# Patient Record
Sex: Female | Born: 1950 | Race: White | Hispanic: No | Marital: Married | Smoking: Never smoker
Health system: Southern US, Community
[De-identification: ages and names within clinical notes are randomized; demographics above are authoritative.]

## PROBLEM LIST (undated history)

## (undated) DIAGNOSIS — E119 Type 2 diabetes mellitus without complications: Secondary | ICD-10-CM

## (undated) DIAGNOSIS — I10 Essential (primary) hypertension: Secondary | ICD-10-CM

## (undated) DIAGNOSIS — F419 Anxiety disorder, unspecified: Secondary | ICD-10-CM

## (undated) DIAGNOSIS — E079 Disorder of thyroid, unspecified: Secondary | ICD-10-CM

## (undated) DIAGNOSIS — K219 Gastro-esophageal reflux disease without esophagitis: Secondary | ICD-10-CM

## (undated) HISTORY — DX: Essential (primary) hypertension: I10

## (undated) HISTORY — PX: BREAST SURGERY: SHX581

## (undated) HISTORY — DX: Gastro-esophageal reflux disease without esophagitis: K21.9

## (undated) HISTORY — PX: DILATION AND CURETTAGE OF UTERUS: SHX78

## (undated) HISTORY — DX: Anxiety disorder, unspecified: F41.9

## (undated) HISTORY — DX: Type 2 diabetes mellitus without complications: E11.9

## (undated) HISTORY — PX: ABDOMINAL HYSTERECTOMY: SHX81

## (undated) HISTORY — PX: FOOT SURGERY: SHX648

## (undated) HISTORY — DX: Disorder of thyroid, unspecified: E07.9

---

## 1999-06-21 ENCOUNTER — Encounter: Admission: RE | Admit: 1999-06-21 | Discharge: 1999-06-21 | Payer: Self-pay | Admitting: Obstetrics and Gynecology

## 1999-06-21 ENCOUNTER — Encounter: Payer: Self-pay | Admitting: Obstetrics and Gynecology

## 2000-01-27 ENCOUNTER — Encounter: Admission: RE | Admit: 2000-01-27 | Discharge: 2000-01-27 | Payer: Self-pay | Admitting: Obstetrics and Gynecology

## 2000-01-27 ENCOUNTER — Encounter: Payer: Self-pay | Admitting: Obstetrics and Gynecology

## 2000-04-23 ENCOUNTER — Ambulatory Visit (HOSPITAL_COMMUNITY): Admission: RE | Admit: 2000-04-23 | Discharge: 2000-04-23 | Payer: Self-pay | Admitting: Cardiology

## 2000-05-02 ENCOUNTER — Encounter: Admission: RE | Admit: 2000-05-02 | Discharge: 2000-05-02 | Payer: Self-pay | Admitting: Endocrinology

## 2000-05-02 ENCOUNTER — Encounter: Payer: Self-pay | Admitting: Endocrinology

## 2000-08-12 ENCOUNTER — Ambulatory Visit (HOSPITAL_COMMUNITY): Admission: RE | Admit: 2000-08-12 | Discharge: 2000-08-12 | Payer: Self-pay | Admitting: Endocrinology

## 2000-08-12 ENCOUNTER — Encounter: Payer: Self-pay | Admitting: Endocrinology

## 2000-12-13 ENCOUNTER — Ambulatory Visit (HOSPITAL_BASED_OUTPATIENT_CLINIC_OR_DEPARTMENT_OTHER): Admission: RE | Admit: 2000-12-13 | Discharge: 2000-12-13 | Payer: Self-pay | Admitting: Orthopedic Surgery

## 2000-12-13 ENCOUNTER — Encounter (INDEPENDENT_AMBULATORY_CARE_PROVIDER_SITE_OTHER): Payer: Self-pay | Admitting: *Deleted

## 2001-07-29 ENCOUNTER — Encounter: Admission: RE | Admit: 2001-07-29 | Discharge: 2001-07-29 | Payer: Self-pay

## 2002-01-02 ENCOUNTER — Emergency Department (HOSPITAL_COMMUNITY): Admission: EM | Admit: 2002-01-02 | Discharge: 2002-01-02 | Payer: Self-pay | Admitting: Emergency Medicine

## 2002-03-19 ENCOUNTER — Encounter (INDEPENDENT_AMBULATORY_CARE_PROVIDER_SITE_OTHER): Payer: Self-pay | Admitting: *Deleted

## 2002-03-19 ENCOUNTER — Ambulatory Visit (HOSPITAL_COMMUNITY): Admission: RE | Admit: 2002-03-19 | Discharge: 2002-03-19 | Payer: Self-pay | Admitting: *Deleted

## 2002-04-01 ENCOUNTER — Encounter: Admission: RE | Admit: 2002-04-01 | Discharge: 2002-04-01 | Payer: Self-pay | Admitting: Endocrinology

## 2002-04-01 ENCOUNTER — Encounter: Payer: Self-pay | Admitting: Endocrinology

## 2003-04-15 ENCOUNTER — Encounter: Admission: RE | Admit: 2003-04-15 | Discharge: 2003-04-15 | Payer: Self-pay | Admitting: Endocrinology

## 2003-10-09 ENCOUNTER — Encounter: Admission: RE | Admit: 2003-10-09 | Discharge: 2003-10-09 | Payer: Self-pay | Admitting: *Deleted

## 2004-04-29 ENCOUNTER — Encounter: Admission: RE | Admit: 2004-04-29 | Discharge: 2004-04-29 | Payer: Self-pay | Admitting: Obstetrics and Gynecology

## 2005-04-11 ENCOUNTER — Encounter (INDEPENDENT_AMBULATORY_CARE_PROVIDER_SITE_OTHER): Payer: Self-pay | Admitting: Specialist

## 2005-04-11 ENCOUNTER — Ambulatory Visit (HOSPITAL_COMMUNITY): Admission: RE | Admit: 2005-04-11 | Discharge: 2005-04-11 | Payer: Self-pay | Admitting: *Deleted

## 2005-05-01 ENCOUNTER — Encounter: Admission: RE | Admit: 2005-05-01 | Discharge: 2005-05-01 | Payer: Self-pay | Admitting: Endocrinology

## 2006-06-27 ENCOUNTER — Encounter: Admission: RE | Admit: 2006-06-27 | Discharge: 2006-06-27 | Payer: Self-pay | Admitting: Endocrinology

## 2006-11-29 ENCOUNTER — Encounter: Admission: RE | Admit: 2006-11-29 | Discharge: 2006-11-29 | Payer: Self-pay | Admitting: Endocrinology

## 2006-12-26 ENCOUNTER — Encounter: Admission: RE | Admit: 2006-12-26 | Discharge: 2006-12-26 | Payer: Self-pay | Admitting: Endocrinology

## 2007-01-11 ENCOUNTER — Ambulatory Visit: Payer: Self-pay | Admitting: Hematology & Oncology

## 2007-01-31 LAB — CBC WITH DIFFERENTIAL/PLATELET
Basophils Absolute: 0 10*3/uL (ref 0.0–0.1)
EOS%: 2.2 % (ref 0.0–7.0)
Eosinophils Absolute: 0.2 10*3/uL (ref 0.0–0.5)
HCT: 34.9 % (ref 34.8–46.6)
HGB: 12.1 g/dL (ref 11.6–15.9)
MCH: 29 pg (ref 26.0–34.0)
MONO#: 0.6 10*3/uL (ref 0.1–0.9)
NEUT#: 5 10*3/uL (ref 1.5–6.5)
NEUT%: 63.4 % (ref 39.6–76.8)
RDW: 14.8 % — ABNORMAL HIGH (ref 11.3–14.5)
lymph#: 2.1 10*3/uL (ref 0.9–3.3)

## 2007-02-04 LAB — RHEUMATOID FACTOR: Rhuematoid fact SerPl-aCnc: <20 IU/mL (ref 0–20)

## 2007-02-04 LAB — PROTEIN ELECTROPHORESIS, SERUM
Albumin ELP: 54.3 % — ABNORMAL LOW (ref 55.8–66.1)
Alpha-1-Globulin: 4.9 % (ref 2.9–4.9)
Alpha-2-Globulin: 10.6 % (ref 7.1–11.8)
Beta Globulin: 7 % (ref 4.7–7.2)
Total Protein, Serum Electrophoresis: 7.2 g/dL (ref 6.0–8.3)

## 2007-02-04 LAB — ANA: Anti Nuclear Antibody(ANA): NEGATIVE

## 2007-02-04 LAB — VITAMIN B12: Vitamin B-12: 352 pg/mL (ref 211–911)

## 2007-03-19 ENCOUNTER — Ambulatory Visit: Payer: Self-pay | Admitting: Hematology & Oncology

## 2007-03-21 LAB — CBC WITH DIFFERENTIAL/PLATELET
BASO%: 0.4 % (ref 0.0–2.0)
Basophils Absolute: 0 10*3/uL (ref 0.0–0.1)
EOS%: 2.7 % (ref 0.0–7.0)
MCH: 28.7 pg (ref 26.0–34.0)
MCHC: 34.5 g/dL (ref 32.0–36.0)
MCV: 83.1 fL (ref 81.0–101.0)
MONO%: 6 % (ref 0.0–13.0)
NEUT%: 56.1 % (ref 39.6–76.8)
RDW: 14.9 % — ABNORMAL HIGH (ref 11.3–14.5)
lymph#: 2.6 10*3/uL (ref 0.9–3.3)

## 2007-03-21 LAB — TECHNOLOGIST REVIEW

## 2007-04-16 ENCOUNTER — Encounter: Admission: RE | Admit: 2007-04-16 | Discharge: 2007-04-16 | Payer: Self-pay | Admitting: Endocrinology

## 2007-06-14 ENCOUNTER — Emergency Department (HOSPITAL_COMMUNITY): Admission: EM | Admit: 2007-06-14 | Discharge: 2007-06-14 | Payer: Self-pay | Admitting: Emergency Medicine

## 2007-12-27 ENCOUNTER — Encounter: Admission: RE | Admit: 2007-12-27 | Discharge: 2007-12-27 | Payer: Self-pay | Admitting: Endocrinology

## 2008-01-17 ENCOUNTER — Encounter: Admission: RE | Admit: 2008-01-17 | Discharge: 2008-01-17 | Payer: Self-pay | Admitting: Endocrinology

## 2008-01-28 ENCOUNTER — Ambulatory Visit (HOSPITAL_COMMUNITY): Admission: RE | Admit: 2008-01-28 | Discharge: 2008-01-28 | Payer: Self-pay | Admitting: *Deleted

## 2008-08-26 ENCOUNTER — Encounter: Admission: RE | Admit: 2008-08-26 | Discharge: 2008-08-26 | Payer: Self-pay | Admitting: Endocrinology

## 2008-11-05 ENCOUNTER — Encounter: Admission: RE | Admit: 2008-11-05 | Discharge: 2008-11-05 | Payer: Self-pay | Admitting: Endocrinology

## 2009-04-27 ENCOUNTER — Encounter: Admission: RE | Admit: 2009-04-27 | Discharge: 2009-04-27 | Payer: Self-pay | Admitting: Endocrinology

## 2009-09-13 ENCOUNTER — Emergency Department (HOSPITAL_COMMUNITY): Admission: EM | Admit: 2009-09-13 | Discharge: 2009-09-13 | Payer: Self-pay | Admitting: Emergency Medicine

## 2009-09-17 ENCOUNTER — Ambulatory Visit (HOSPITAL_BASED_OUTPATIENT_CLINIC_OR_DEPARTMENT_OTHER): Admission: RE | Admit: 2009-09-17 | Discharge: 2009-09-17 | Payer: Self-pay | Admitting: Otolaryngology

## 2010-06-03 ENCOUNTER — Encounter
Admission: RE | Admit: 2010-06-03 | Discharge: 2010-06-03 | Payer: Self-pay | Source: Home / Self Care | Attending: Endocrinology | Admitting: Endocrinology

## 2010-06-12 ENCOUNTER — Encounter: Payer: Self-pay | Admitting: Endocrinology

## 2010-08-09 LAB — BASIC METABOLIC PANEL
BUN: 13 mg/dL (ref 6–23)
CO2: 30 mEq/L (ref 19–32)
Calcium: 8.9 mg/dL (ref 8.4–10.5)
Chloride: 105 mEq/L (ref 96–112)
Creatinine, Ser: 0.88 mg/dL (ref 0.4–1.2)
GFR calc Af Amer: >60 mL/min (ref >60)
GFR calc non Af Amer: >60 mL/min (ref >60)
Glucose, Bld: 126 mg/dL — ABNORMAL HIGH (ref 70–99)
Potassium: 3.9 mEq/L (ref 3.5–5.1)
Sodium: 141 mEq/L (ref 135–145)

## 2010-08-09 LAB — GLUCOSE, CAPILLARY: Glucose-Capillary: 127 mg/dL — ABNORMAL HIGH (ref 70–99)

## 2010-08-09 LAB — POCT HEMOGLOBIN-HEMACUE: Hemoglobin: 12 g/dL (ref 12.0–15.0)

## 2010-09-02 ENCOUNTER — Other Ambulatory Visit: Payer: Self-pay | Admitting: Gastroenterology

## 2010-10-04 NOTE — Op Note (Signed)
Laura [name], HADLOCK                 ACCOUNT NO.:  [id number]   MEDICAL RECORD NO.:  [id number]          PATIENT TYPE:  AMB   LOCATION:  ENDO                         FACILITY:  [facility]   PHYSICIAN:  Georgiana Spinner, M.D.    DATE OF BIRTH:  November 04, 1950   DATE OF PROCEDURE:  01/28/2008  DATE OF DISCHARGE:                               OPERATIVE REPORT   PROCEDURE:  Upper endoscopy.   INDICATIONS:  Abdominal pain.   ANESTHESIA:  Fentanyl 100 mcg, Versed 10 mg.   PROCEDURE:  With the patient mildly sedated in the left lateral  decubitus position, the Pentax videoscopic endoscope was inserted in the  mouth, passed under direct vision through the esophagus which appeared  normal.  We entered into the stomach.  Fundus, body, antrum appeared  normal as did duodenal bulb, second portion of duodenum.  From this  point the endoscope was slowly withdrawn, taking circumferential views  of duodenal mucosa until the endoscope had been pulled back into the  stomach, placed in retroflexion to view the stomach from below.  The  endoscope was straightened and withdrawn, taking circumferential views  of the remaining gastric and esophageal mucosa.  The patient's vital  signs and pulse oximeter remained stable.  The patient tolerated the  procedure well without apparent complication.   FINDINGS:  Negative examination planned.  The patient states her  symptoms are improved, but will have her follow-up with me as an  outpatient.           ______________________________  Georgiana Spinner, M.D.     GMO/MEDQ  D:  01/28/2008  T:  01/28/2008  Job:  [id number]

## 2010-10-07 NOTE — Op Note (Signed)
   NAME:  Laura [name], Laura [name]                           ACCOUNT NO.:  [id number]   MEDICAL RECORD NO.:  [id number]                   PATIENT TYPE:  AMB   LOCATION:  ENDO                                 [facility]ACILITY:  [facility]   PHYSICIAN:  Georgiana Spinner, M.D.                 DATE OF BIRTH:  1951-04-12   DATE OF PROCEDURE:  03/19/2002  DATE OF DISCHARGE:                                 OPERATIVE REPORT   PROCEDURE:  Colonoscopy.   INDICATIONS FOR PROCEDURE:  Previous colon polyps.   ANESTHESIA:  Demerol 30 mg, Versed 3 mg.   PROCEDURE:  With the patient mildly sedated in the left lateral decubitus  position, the Olympus videoscopic colonoscope was inserted into the rectum  and passed under direct vision with pressure applied to the abdomen we  reached the cecum.  The cecum was identified by ileocecal valve and  appendiceal orifice, both of which were photographed.  From this point, the  colonoscope was slowly withdrawn taking circumferential views of the entire  colonic mucosa, stopping only in the rectum, which appeared normal and  showed hemorrhoids on retroflex view.  The endoscope was straightened,  withdrawn.  The patient's vital signs and pulse oximetry remained stable.  The patient tolerated the procedure well without apparent complications.   FINDINGS:  Internal hemorrhoids.  Otherwise, unremarkable colonoscopic  examination to the cecum.   PLAN:  Repeat examination in five years.                                                Georgiana Spinner, M.D.    GMO/MEDQ  D:  03/19/2002  T:  03/19/2002  Job:  [id number]

## 2010-10-07 NOTE — Cardiovascular Report (Signed)
[place]  Patient:    Laura [name], Laura [name]                        MRN: [id number] Proc. Date: 04/23/00 Adm. Date:  [id number] Attending:  Silvestre Mesi CC:         Bernadene Person, M.D.  Cardiac Catheterization Laboratory   Cardiac Catheterization  PROCEDURES: 1. Left heart catheterization. 2. Coronary cineangiography. 3. Left ventricular cineangiography. 4. Perclose of the right femoral artery.  REFERRING PHYSICIAN:  Bernadene Person, M.D.  INDICATIONS FOR PROCEDURES:  This 60 year old female presented with very typical angina type pain and a treadmill exercise tolerance test done in our office was early positive for myocardial ischemia.  She was then scheduled for cardiac catheterization.  DESCRIPTION OF PROCEDURE:  After signing an informed consent, the patient was premedicated with 50 mg of Benadryl intravenously and brought to the cardiac catheterization lab at [facility].  Her right groin was prepped and draped in a sterile fashion and anesthetized locally with 1% lidocaine.  A #6 French introducer sheath was inserted percutaneously into the right femoral artery.  The 6 [place] #4 Judkins coronary catheters were used to make injections into the coronary arteries.  A 6 French pigtail catheter was used to measure pressures in the left ventricle and aorta and to make mid stream injections into the left ventricle.  The patient tolerated the procedure well and no complications were noted.  At the end of the procedure, the catheter and sheaths were removed from the right femoral artery and hemostasis was easily obtained with a Perclose closure system.  MEDICATIONS GIVEN:  None.  HEMODYNAMIC DATA:  Left ventricular pressure 177/0-18, aortic pressure 176/95 with a mean of 126.  Left ventricular ejection fraction 68%.  CINE FINDINGS:  CORONARY CINEANGIOGRAPHY:  Left coronary artery:  The ostium and left main appear  normal.  Left anterior descending:  Appears normal.  Circumflex coronary artery:  The circumflex coronary artery appears normal.  Right coronary artery:  Appears normal.  LEFT VENTRICULAR CINEANGIOGRAM:  The left ventricular chamber size, contractility and wall thickness appear normal.  The mitral and aortic valves appear normal.  FINAL DIAGNOSES: 1. Normal coronary arteries. 2. Normal left ventricular function. 3. Normal mitral and aortic valves. 4. Successful Perclose of the right femoral artery.  DISPOSITION:  We will monitor on the short-stay unit prior to discharge later today.  We will refer back to Dr. Juleen China for further management and we will also followup the patient in the office in two weeks after discharge. DD:  04/23/00 TD:  04/23/00 Job: [id number]

## 2010-10-07 NOTE — Op Note (Signed)
NAMECALVARY, Laura [name]                 ACCOUNT NO.:  [id number]   MEDICAL RECORD NO.:  [id number]          PATIENT TYPE:  AMB   LOCATION:  ENDO                         FACILITY:  [facility]   PHYSICIAN:  Georgiana Spinner, M.D.    DATE OF BIRTH:  1950-06-05   DATE OF PROCEDURE:  04/11/2005  DATE OF DISCHARGE:                                 OPERATIVE REPORT   PROCEDURE:  Upper endoscopy.   INDICATIONS:  GERD.   ANESTHESIA:  Demerol 60 Versed 8 mg.   PROCEDURE:  With the patient mildly sedated in the left lateral decubitus  position, the Olympus videoscopic endoscope was inserted in the mouth and  passed under direct vision through the esophagus which appeared normal,  except there was one area of redness that may have been just normal variant,  which I decided to photograph and biopsy.  We entered into the stomach.  Fundus, body, antrum, duodenal bulb, second portion duodenum all appeared  normal.  From this point, the endoscope was slowly withdrawn taking  circumferential views of the duodenal mucosa until the endoscope had been  pulled back into the stomach and placed in retroflexion to view the stomach  from below. The endoscope was straightened and withdrawn, taking  circumferential views of the remaining gastric and esophageal mucosa. The  patient's vital signs and pulse oximeter remained stable. The patient  tolerated procedure well without apparent complications.   FINDINGS:  A small area of Barrett's esophagus, probably normal variant,  biopsied.  Await biopsy report. The patient will call me for results and  follow-up with me as an outpatient.  Proceed to colonoscopy.           ______________________________  Georgiana Spinner, M.D.     GMO/MEDQ  D:  04/11/2005  T:  04/11/2005  Job:  [id number]

## 2010-10-07 NOTE — Op Note (Signed)
NAMECHASTIN, Laura [name]                 ACCOUNT NO.:  [id number]   MEDICAL RECORD NO.:  [id number]          PATIENT TYPE:  AMB   LOCATION:  ENDO                         FACILITY:  [facility]   PHYSICIAN:  Georgiana Spinner, M.D.    DATE OF BIRTH:  [birth year]   DATE OF PROCEDURE:  04/10/2005  DATE OF DISCHARGE:                                 OPERATIVE REPORT   PROCEDURE:  Colonoscopy.   INDICATIONS:  Colon cancer screening.   ANESTHESIA:  Versed 2 mg.   PROCEDURE:  With the patient mildly sedated in the left lateral decubitus  position, subsequently rolled to her back with pressure applied, the Olympus  videoscopic colonoscope had been inserted in the rectum and was passed under  direct vision to the cecum, identified by the ileocecal valve and  appendiceal orifice, both of which were photographed.  From this point the  colonoscope was slowly withdrawn taking circumferential views of colonic  mucosa, stopping only in the rectum, which appeared normal on direct and  showed hemorrhoids on retroflexed view.  The endoscope was straightened and  withdrawn.  The patient's vital signs and pulse oximetry remained stable.  The patient tolerated the procedure well without complications.   FINDINGS:  Internal hemorrhoids, otherwise an unremarkable colonoscopic  examination to the cecum.   PLAN:  Consider a repeat examination possibly in five to 10 years           ______________________________  Georgiana Spinner, M.D.     GMO/MEDQ  D:  04/11/2005  T:  04/11/2005  Job:  [id number]

## 2010-10-07 NOTE — Op Note (Signed)
   NAME:  Laura [name], Laura [name]                           ACCOUNT NO.:  [id number]   MEDICAL RECORD NO.:  [id number]                   PATIENT TYPE:  AMB   LOCATION:  ENDO                                 [facility]ACILITY:  [facility]   PHYSICIAN:  Georgiana Spinner, M.D.                 DATE OF BIRTH:  1950/06/08   DATE OF PROCEDURE:  03/19/2002  DATE OF DISCHARGE:                                 OPERATIVE REPORT   PROCEDURE:  Upper endoscopy.   INDICATIONS FOR PROCEDURE:  GERD.   ANESTHESIA:  Demerol 70 mg, Versed 7 mg.   PROCEDURE:  With the patient mildly sedated in the left lateral decubitus  position, the Olympus videoscopic endoscope was inserted into the mouth,  passed under direct vision through the esophagus, which appeared normal  except for a questionable area of Barrett's esophagus that was seen on  withdrawal which we photographed and biopsied.  This may have been normal  anatomy.  We entered into the stomach.  Fundus, body, antrum, duodenal bulb,  second portion of the duodenum and all appeared normal.  From this point,  the endoscope was slowly withdrawn taking circumferential views of the  entire duodenal mucosa until the endoscope was pulled back into the stomach,  placed in retroflex to view the stomach from below and possibly incomplete  wrap of the GE junction was seen.  This was photographed.  The endoscope was  then straightened, withdrawn, taking circumferential views of the remaining  gastric and esophageal mucosa.  The patient's vital signs and pulse oximetry  remained stable.  The patient tolerated the procedure well without apparent  complications.   FINDINGS:  1. Question of Barrett's esophagus.  2. Possibly normal variant of an incomplete wrap of the gastroesophageal     junction around the endoscope.   PLAN:  Await biopsy report.  The patient will call me for results and follow  up with me as an outpatient.  Continue present PPI therapy.                  Georgiana Spinner, M.D.    GMO/MEDQ  D:  03/19/2002  T:  03/19/2002  Job:  [id number]

## 2010-10-07 NOTE — Op Note (Signed)
[place]  Patient:    Laura [name], Laura [name]                          MRN: [id number] Proc. Date: 12/13/00 Attending:  Nicki Reaper, M.D. CC:         [facility] x 2   Operative Report  PREOPERATIVE DIAGNOSIS:  Cyst, left wrist.  POSTOPERATIVE DIAGNOSIS:  Cyst, left wrist.  OPERATION:  Excision of cyst, left wrist.  SURGEON:  Nicki Reaper, M.D.  ASSISTANT:  Joaquin Courts, R.N.  ANESTHESIA:  IV regional.  ANESTHESIOLOGIST:  Guadalupe Maple, M.D.  HISTORY:  The patient is a 60 year old female with a history of a mass on the radial aspect of her wrist.  She desires having this removed.  DESCRIPTION OF PROCEDURE:  The patient was brought to the operating room where an IV regional anesthetic was carried out without difficulty.  She was prepped and draped using Betadine scrub and solution, with the left arm free.  A transverse incision was made directly over the mass.  This was in the ______ and carried down through subcutaneous tissue.  Neurovascular structures were protected.  The cyst was medially encountered with blunt and S[place]deo dissection and this was dissected free.  The stalk was followed onto the volar aspect. The volar entrance into the joint was opened and debrided.  The wound was copiously irrigated with saline.  The specimen was sent to pathology.  The subcutaneous tissue was closed with interrupted 4-0 Vicryl and the skin with a subcuticular 4-0 Monocryl suture.  Steri-Strips were applied.  Sterile compressive dressing and splint was applied.  The patient tolerated the procedure well and was taken to the recovery room for observation in satisfactory condition.  She is discharged home to return to the [facility] in one week on Vicodin and Keflex. DD:  12/13/00 TD:  12/13/00 Job: [id number]

## 2011-02-09 LAB — COMPREHENSIVE METABOLIC PANEL
ALT: 13
AST: 18
Albumin: 3.1 — ABNORMAL LOW
Alkaline Phosphatase: 81
Calcium: 8.8
GFR calc Af Amer: >60
Potassium: 3.7
Sodium: 140
Total Protein: 6.6

## 2011-02-09 LAB — DIFFERENTIAL
Basophils Relative: 0
Eosinophils Relative: 2
Lymphs Abs: 2.1
Monocytes Absolute: 0.5
Monocytes Relative: 6

## 2011-02-09 LAB — URINALYSIS, ROUTINE W REFLEX MICROSCOPIC
Bilirubin Urine: NEGATIVE
Glucose, UA: NEGATIVE
Ketones, ur: NEGATIVE
Protein, ur: NEGATIVE
pH: 6

## 2011-02-09 LAB — CBC
Hemoglobin: 12.1
MCHC: 33.5
Platelets: ADEQUATE
RDW: 15.6 — ABNORMAL HIGH

## 2011-02-09 LAB — LIPASE, BLOOD: Lipase: 19

## 2011-05-12 IMAGING — CT CT MAXILLOFACIAL W/O CM
3 of 4 series · 15 of 47 positions shown, 18 images · non-contrast
Comparison: None.

CT HEAD

CLINICAL DATA: Fall striking face, abrasions and forehead, nose
and hands, pain

CT HEAD WITHOUT CONTRAST
CT MAXILLOFACIAL WITHOUT CONTRAST
TECHNIQUE: Multidetector CT imaging of the head and maxillofacial
structures were performed using the standard protocol without
intravenous contrast. Multiplanar CT image reconstructions of the
maxillofacial structures were also generated.

[Series 4: max st 2.0 h31s · axial · 0.34mm/px · z∈[+36,+172]mm · 9 of 85 slices shown, 12 images]
[im 9/85  brain]
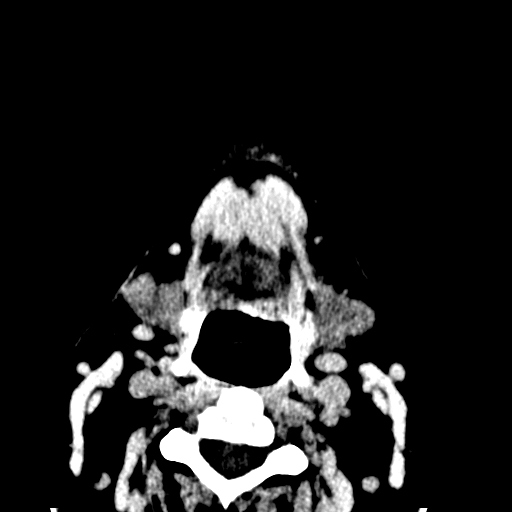
[im 9/85  bone]
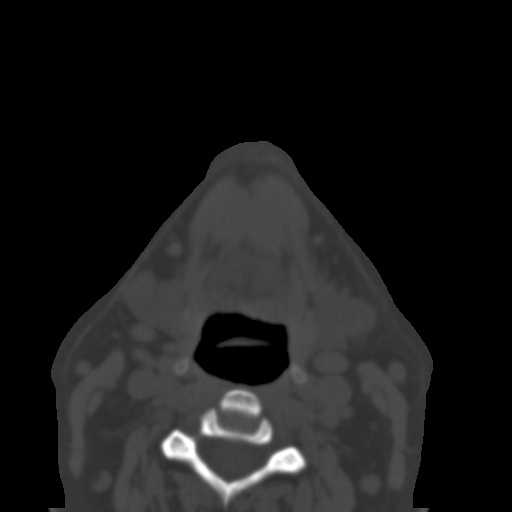
[im 17/85  bone]
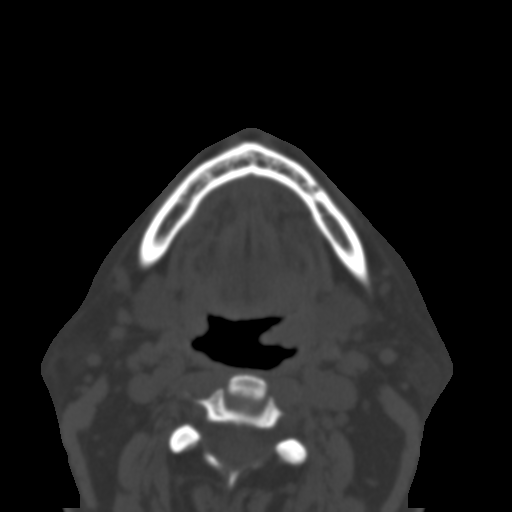
[im 25/85  bone]
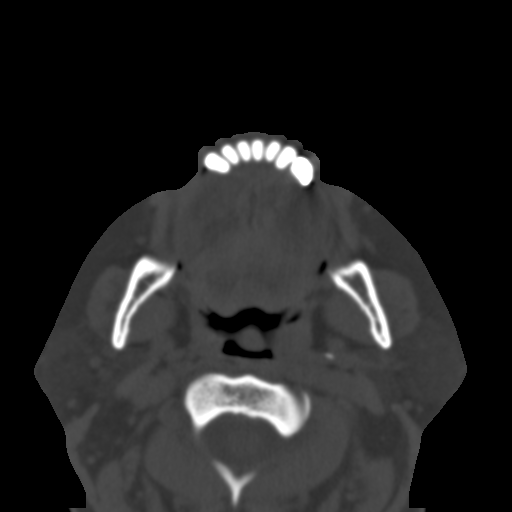
[im 33/85  bone]
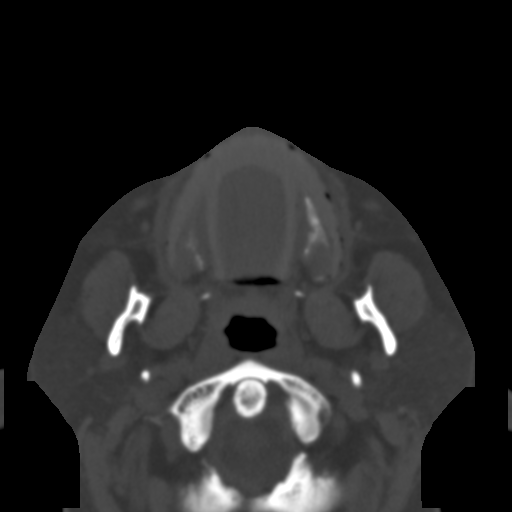
[im 45/85  brain]
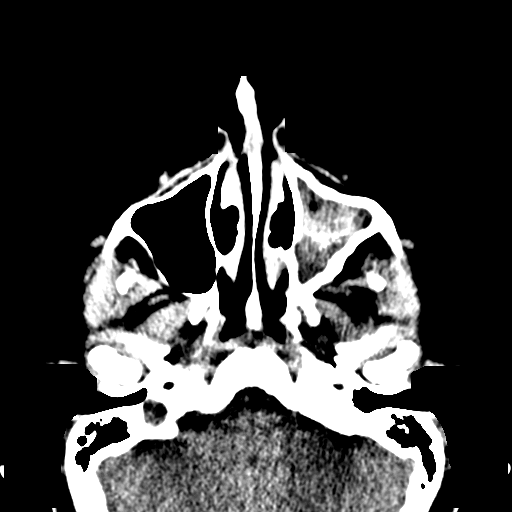
[im 45/85  bone]
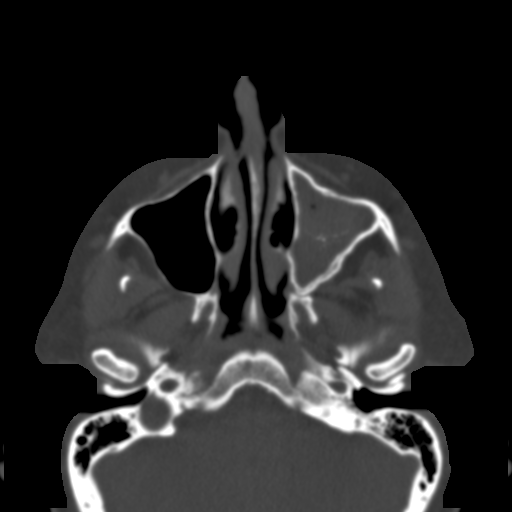
[im 53/85  bone]
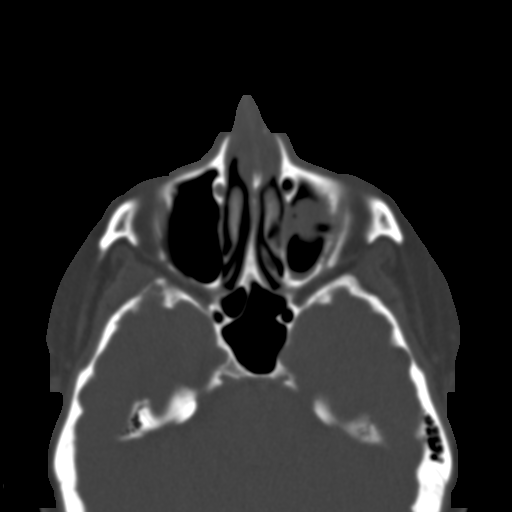
[im 61/85  bone]
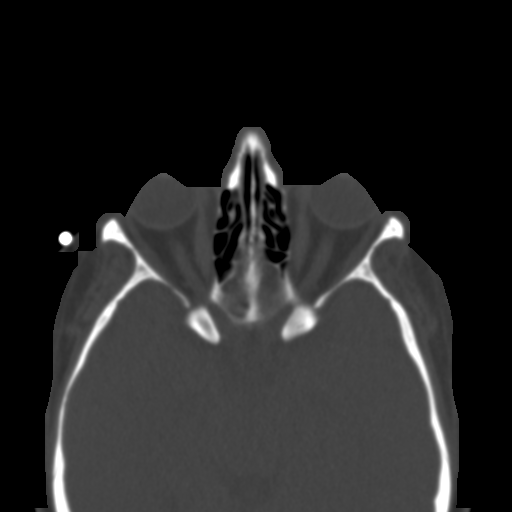
[im 69/85  bone]
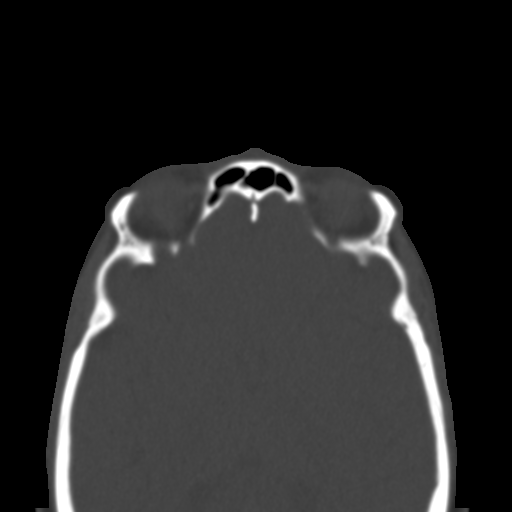
[im 77/85  brain]
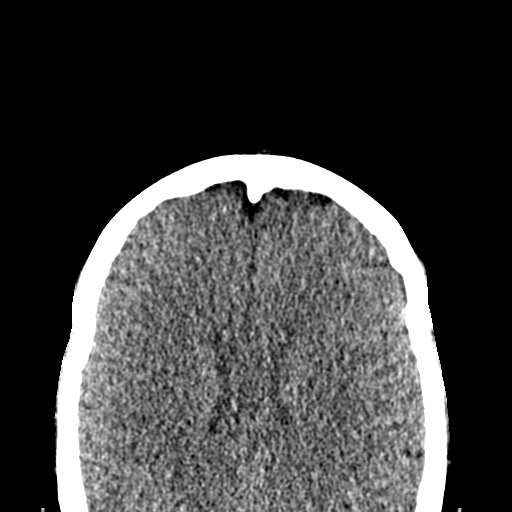
[im 77/85  bone]
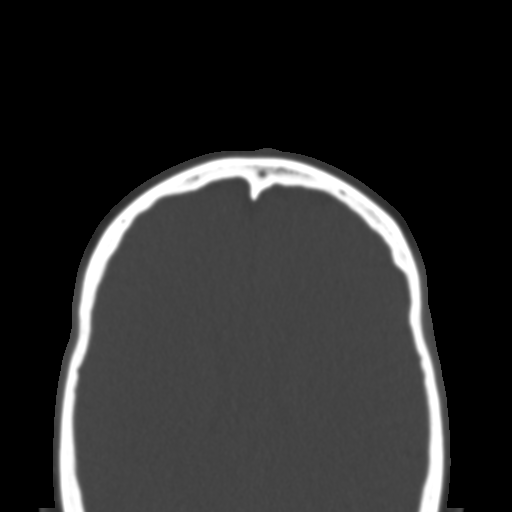

[Series 6: max st coronal · coronal · 0.33mm/px · 3 of 72 slices shown]
[im 24/72  bone]
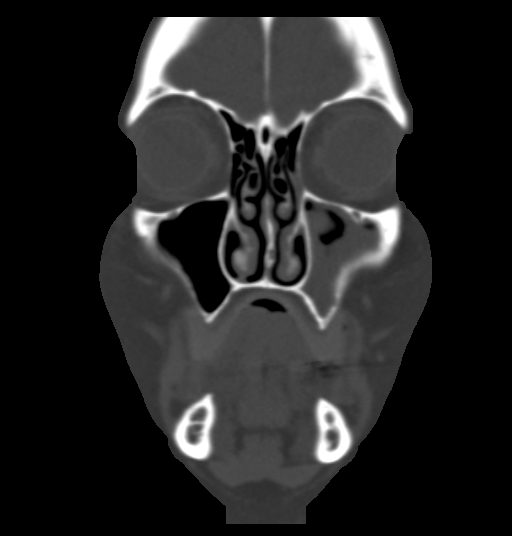
[im 32/72  bone]
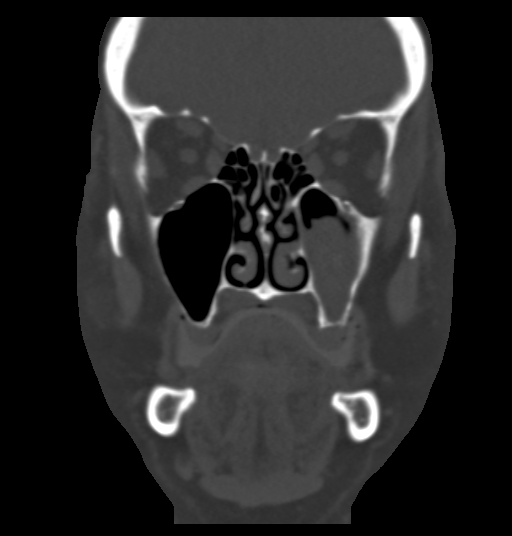
[im 40/72  bone]
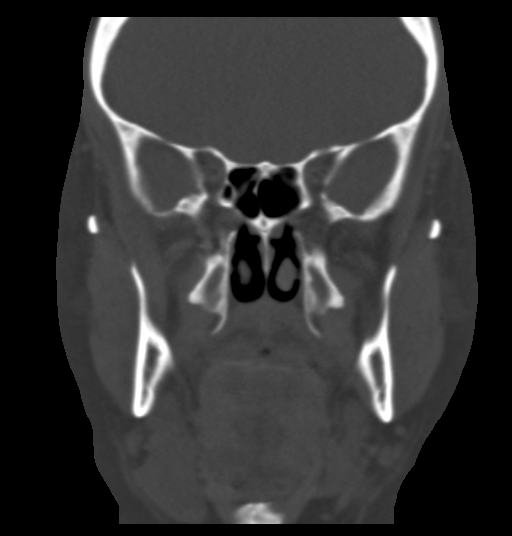

[Series 7: max st sag · sagittal · 0.32mm/px · 3 of 85 slices shown]
[im 29/85  bone]
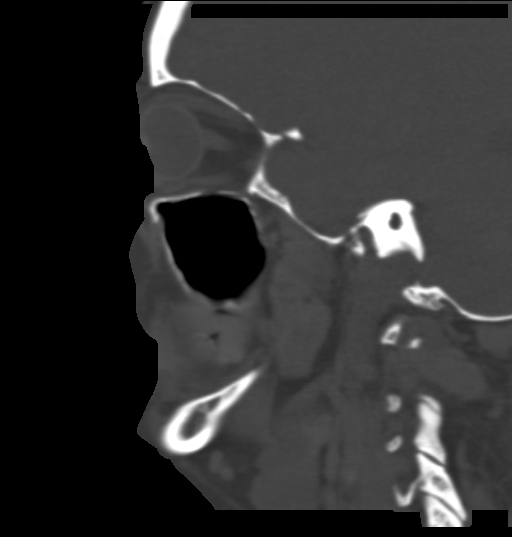
[im 43/85  bone]
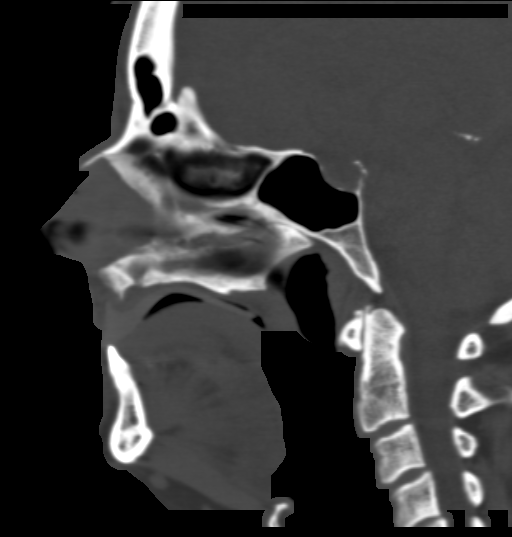
[im 57/85  bone]
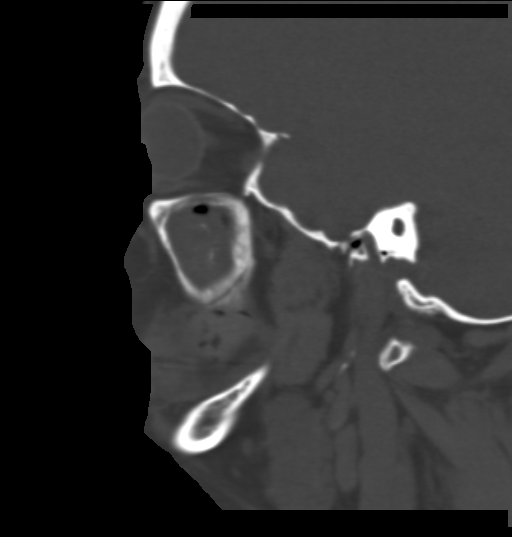

[15 of 47 positions shown; findings below may reference images not displayed]

FINDINGS: Streak artifacts at skull base.
Normal ventricular morphology.
No midline shift or mass effect.
Normal appearance of brain parenchyma.
No intracranial hemorrhage, mass lesion, or evidence of acute
infarction.
Minimal frontal scalp soft tissue swelling and hematoma.
Sub total opacification of the left maxillary sinus.
Left nasal bone fracture.
Calvarium intact.
IMPRESSION: No acute intracranial abnormalities, see below.

CT MAXILLOFACIAL
FINDINGS: Frontal scalp soft tissue swelling and small hematoma.
Intraorbital tissue planes clear.
Right side of face marked with a BB.
Sub total opacification of left maxillary sinus, containing high
attenuation question calcification, could represent inspissated
mucus or mucocele.
Scattered normal-sized anterior cervical lymph nodes bilaterally.
Symmetric appearance of parotid and submandibular glands.
Visualized intracranial structures unremarkable.
Bilateral nasal bone fractures, mildly displaced on the left.
Zygomas, orbits, and sinuses appear intact.
Chronic thickening of the osseous walls of the left maxillary sinus
suggests chronic sinusitis.
Minimal nasal septal deviation to the left.
No definite additional facial bone fracture identified.
IMPRESSION: Bilateral nasal bone fractures, minimally displaced on left.
No other definite facial bone fractures identified.
Suspect chronic sinusitis of the left maxillary sinus, cannot
exclude inspissated mucus or small mucocele.

## 2011-05-26 ENCOUNTER — Other Ambulatory Visit: Payer: Self-pay | Admitting: Endocrinology

## 2011-05-26 DIAGNOSIS — Z1231 Encounter for screening mammogram for malignant neoplasm of breast: Secondary | ICD-10-CM

## 2011-06-16 ENCOUNTER — Ambulatory Visit: Payer: Self-pay

## 2011-08-02 ENCOUNTER — Ambulatory Visit
Admission: RE | Admit: 2011-08-02 | Discharge: 2011-08-02 | Disposition: A | Discharge status: Home / Self Care | Payer: Managed Care, Other (non HMO) | Source: Ambulatory Visit | Attending: Endocrinology | Admitting: Endocrinology

## 2011-08-02 DIAGNOSIS — Z1231 Encounter for screening mammogram for malignant neoplasm of breast: Secondary | ICD-10-CM

## 2012-06-24 ENCOUNTER — Ambulatory Visit (INDEPENDENT_AMBULATORY_CARE_PROVIDER_SITE_OTHER): Payer: Self-pay | Admitting: Surgery

## 2012-06-25 ENCOUNTER — Encounter (INDEPENDENT_AMBULATORY_CARE_PROVIDER_SITE_OTHER): Payer: Self-pay

## 2012-06-28 ENCOUNTER — Other Ambulatory Visit (INDEPENDENT_AMBULATORY_CARE_PROVIDER_SITE_OTHER): Payer: Self-pay

## 2012-06-28 ENCOUNTER — Encounter (INDEPENDENT_AMBULATORY_CARE_PROVIDER_SITE_OTHER): Payer: Self-pay | Admitting: Surgery

## 2012-06-28 ENCOUNTER — Ambulatory Visit (INDEPENDENT_AMBULATORY_CARE_PROVIDER_SITE_OTHER): Payer: Private Health Insurance - Indemnity | Admitting: Surgery

## 2012-06-28 VITALS — BP 146/86 | HR 78 | Temp 98.3°F | Resp 16 | Ht 63.0 in | Wt 241.0 lb

## 2012-06-28 DIAGNOSIS — R109 Unspecified abdominal pain: Secondary | ICD-10-CM

## 2012-06-28 NOTE — Progress Notes (Signed)
Patient ID: Laura [name], female   DOB: Jun 06, 1950, 62 y.o.   MRN: [id number]  No chief complaint on file.   HPI Laura [name] is a 62 y.o. female.  Patient sent at the request of Dr. Juleen China for abdominal pain. She has a bulge in her upper abdomen she's had for a number of months. It's causing mild to moderate discomfort. Made worse with standing. Eating makes the bulge "hard". No nausea vomiting. HPI  Past Medical History  Diagnosis Date  . GERD (gastroesophageal reflux disease)   . Hypertension   . Anxiety   . Diabetes mellitus without complication   . Thyroid disease     Past Surgical History  Procedure Date  . Abdominal hysterectomy   . Dilation and curettage of uterus   . Breast surgery     breast mass    Family History  Problem Relation Age of Onset  . Hypertension Sister   . Ovarian cancer Daughter   . Leukemia Brother   . Thyroid disease Daughter     Social History History  Substance Use Topics  . Smoking status: Never Smoker   . Smokeless tobacco: Not on file  . Alcohol Use: No    Allergies  Allergen Reactions  . Aleve (Naproxen Sodium)   . Allegra (Fexofenadine Hcl)   . Avapro (Irbesartan)   . Cephalosporins   . Cytomel (Liothyronine) Hives  . Iohexol      Code: HIVES, Desc: HIVES S/P CONTRAST INJECTION//A.C., Onset Date: [id number]   . Ivp Dye (Iodinated Diagnostic Agents) Hives  . Januvia (Sitagliptin)   . Kombiglyze (Saxagliptin-Metformin Er)   . Lotrel (Amlodipine Besy-Benazepril Hcl)   . Metformin And Related   . Sulfa Antibiotics   . Toviaz (Fesoterodine Fumarate Er)     Current Outpatient Prescriptions  Medication Sig Dispense Refill  . aspirin 81 MG tablet Take 81 mg by mouth daily.      . Cholecalciferol (VITAMIN D PO) Take by mouth.      . dexlansoprazole (DEXILANT) 60 MG capsule Take 60 mg by mouth daily.      . diazepam (VALIUM) 5 MG tablet Take 5 mg by mouth every 6 (six) hours as needed.      Marland Kitchen estradiol (CLIMARA - DOSED IN MG/24  HR) 0.05 mg/24hr Place 1 patch onto the skin once a week.      . Glucosamine-Chondroit-Vit C-Mn (GLUCOSAMINE 1500 COMPLEX PO) Take by mouth.      . levothyroxine (SYNTHROID, LEVOTHROID) 175 MCG tablet Take 175 mcg by mouth daily.      Marland Kitchen losartan (COZAAR) 50 MG tablet Take 50 mg by mouth daily.      . metoprolol succinate (TOPROL-XL) 100 MG 24 hr tablet Take 100 mg by mouth daily. Take with or immediately following a meal.      . Multiple Vitamin (MULTI-VITAMIN DAILY PO) Take by mouth.      . pioglitazone-metformin (ACTOPLUS MET) 15-850 MG per tablet Take 1 tablet by mouth 2 (two) times daily with a meal.      . potassium chloride (KLOR-CON) 20 MEQ packet Take 20 mEq by mouth 2 (two) times daily.      Marland Kitchen triamterene-hydrochlorothiazide (MAXZIDE-25) 37.5-25 MG per tablet Take 1 tablet by mouth daily.        Review of Systems Review of Systems  Constitutional: Negative.   HENT: Negative.   Eyes: Negative.   Respiratory: Negative.   Cardiovascular: Negative.   Gastrointestinal: Positive for abdominal pain and abdominal distention.  Genitourinary: Negative.   Musculoskeletal: Negative.   Neurological: Negative.   Hematological: Negative.   Psychiatric/Behavioral: Negative.     Blood pressure 146/86, pulse 78, temperature 98.3 F (36.8 C), resp. rate 16, height 5\' 3"  (1.6 m), weight 241 lb (109.317 kg).  Physical Exam Physical Exam  Constitutional: She is oriented to person, place, and time. She appears well-developed and well-nourished.  HENT:  Head: Normocephalic and atraumatic.  Eyes: EOM are normal. Pupils are equal, round, and reactive to light.  Neck: Normal range of motion. Neck supple.  Cardiovascular: Normal rate and regular rhythm.   Pulmonary/Chest: Effort normal and breath sounds normal.  Abdominal: She exhibits no distension. There is no tenderness.    Musculoskeletal: Normal range of motion.  Neurological: She is alert and oriented to person, place, and time.  Skin:  Skin is warm.  Psychiatric: She has a normal mood and affect. Her behavior is normal. Judgment and thought content normal.      Assessment    Abdominal pain   Diastasis vs ventral hernia    Plan    Needs CT scan to evaluate further.  Return based on findings.       Janson Lamar A. 06/28/2012, 11:14 AM

## 2012-06-28 NOTE — Patient Instructions (Signed)
Will check CT scan to evaluate abdominal pain.

## 2012-07-02 ENCOUNTER — Ambulatory Visit
Admission: RE | Admit: 2012-07-02 | Discharge: 2012-07-02 | Disposition: A | Discharge status: Home / Self Care | Payer: Managed Care, Other (non HMO) | Source: Ambulatory Visit | Attending: Surgery | Admitting: Surgery

## 2012-07-02 DIAGNOSIS — R109 Unspecified abdominal pain: Secondary | ICD-10-CM

## 2012-07-08 ENCOUNTER — Telehealth (INDEPENDENT_AMBULATORY_CARE_PROVIDER_SITE_OTHER): Payer: Self-pay

## 2012-07-08 NOTE — Telephone Encounter (Signed)
LMOM to call to give ct results. Negative, no signs of hernia or anything needing surgery. Follow up with PCP.

## 2012-07-08 NOTE — Telephone Encounter (Signed)
Pt returned call and was given her CT results.

## 2012-08-21 ENCOUNTER — Encounter (INDEPENDENT_AMBULATORY_CARE_PROVIDER_SITE_OTHER): Payer: Self-pay

## 2012-10-18 ENCOUNTER — Other Ambulatory Visit: Payer: Self-pay

## 2012-10-18 DIAGNOSIS — Z1231 Encounter for screening mammogram for malignant neoplasm of breast: Secondary | ICD-10-CM

## 2012-11-19 ENCOUNTER — Ambulatory Visit
Admission: RE | Admit: 2012-11-19 | Discharge: 2012-11-19 | Disposition: A | Discharge status: Home / Self Care | Payer: Managed Care, Other (non HMO) | Source: Ambulatory Visit

## 2012-11-19 DIAGNOSIS — Z1231 Encounter for screening mammogram for malignant neoplasm of breast: Secondary | ICD-10-CM

## 2012-11-20 ENCOUNTER — Ambulatory Visit: Payer: Managed Care, Other (non HMO)

## 2013-10-10 ENCOUNTER — Other Ambulatory Visit: Payer: Self-pay

## 2013-10-10 DIAGNOSIS — Z1231 Encounter for screening mammogram for malignant neoplasm of breast: Secondary | ICD-10-CM

## 2013-11-28 ENCOUNTER — Ambulatory Visit
Admission: RE | Admit: 2013-11-28 | Discharge: 2013-11-28 | Disposition: A | Discharge status: Home / Self Care | Payer: Private Health Insurance - Indemnity | Source: Ambulatory Visit

## 2013-11-28 ENCOUNTER — Encounter (INDEPENDENT_AMBULATORY_CARE_PROVIDER_SITE_OTHER): Payer: Self-pay

## 2013-11-28 DIAGNOSIS — Z1231 Encounter for screening mammogram for malignant neoplasm of breast: Secondary | ICD-10-CM

## 2013-12-17 DIAGNOSIS — E1165 Type 2 diabetes mellitus with hyperglycemia: Secondary | ICD-10-CM

## 2013-12-17 DIAGNOSIS — E119 Type 2 diabetes mellitus without complications: Secondary | ICD-10-CM | POA: Insufficient documentation

## 2014-03-26 DIAGNOSIS — I10 Essential (primary) hypertension: Secondary | ICD-10-CM | POA: Insufficient documentation

## 2014-06-25 ENCOUNTER — Other Ambulatory Visit: Payer: Self-pay | Admitting: Gastroenterology

## 2014-08-12 ENCOUNTER — Ambulatory Visit (HOSPITAL_COMMUNITY)
Admission: RE | Admit: 2014-08-12 | Discharge: 2014-08-12 | Disposition: A | Discharge status: Home / Self Care | Payer: Managed Care, Other (non HMO) | Source: Ambulatory Visit | Attending: Endocrinology | Admitting: Endocrinology

## 2014-08-12 ENCOUNTER — Other Ambulatory Visit (HOSPITAL_COMMUNITY): Payer: Self-pay | Admitting: Endocrinology

## 2014-08-12 DIAGNOSIS — R109 Unspecified abdominal pain: Secondary | ICD-10-CM | POA: Insufficient documentation

## 2014-08-12 DIAGNOSIS — Z90710 Acquired absence of both cervix and uterus: Secondary | ICD-10-CM | POA: Diagnosis not present

## 2014-08-12 DIAGNOSIS — Z9049 Acquired absence of other specified parts of digestive tract: Secondary | ICD-10-CM | POA: Insufficient documentation

## 2014-12-17 DIAGNOSIS — E1169 Type 2 diabetes mellitus with other specified complication: Secondary | ICD-10-CM | POA: Insufficient documentation

## 2014-12-17 DIAGNOSIS — E785 Hyperlipidemia, unspecified: Secondary | ICD-10-CM

## 2014-12-17 DIAGNOSIS — E039 Hypothyroidism, unspecified: Secondary | ICD-10-CM | POA: Insufficient documentation

## 2015-02-26 ENCOUNTER — Other Ambulatory Visit: Payer: Self-pay

## 2015-02-26 DIAGNOSIS — Z1231 Encounter for screening mammogram for malignant neoplasm of breast: Secondary | ICD-10-CM

## 2015-03-26 ENCOUNTER — Ambulatory Visit
Admission: RE | Admit: 2015-03-26 | Discharge: 2015-03-26 | Disposition: A | Discharge status: Home / Self Care | Payer: Managed Care, Other (non HMO) | Source: Ambulatory Visit

## 2015-03-26 DIAGNOSIS — Z1231 Encounter for screening mammogram for malignant neoplasm of breast: Secondary | ICD-10-CM

## 2015-04-22 ENCOUNTER — Other Ambulatory Visit: Payer: Self-pay | Admitting: Obstetrics & Gynecology

## 2015-04-22 ENCOUNTER — Ambulatory Visit
Admission: RE | Admit: 2015-04-22 | Discharge: 2015-04-22 | Disposition: A | Discharge status: Home / Self Care | Payer: Managed Care, Other (non HMO) | Source: Ambulatory Visit | Attending: Obstetrics & Gynecology | Admitting: Obstetrics & Gynecology

## 2015-04-22 DIAGNOSIS — R0601 Orthopnea: Secondary | ICD-10-CM

## 2015-04-22 DIAGNOSIS — R059 Cough, unspecified: Secondary | ICD-10-CM

## 2015-04-22 DIAGNOSIS — R05 Cough: Secondary | ICD-10-CM

## 2015-05-28 ENCOUNTER — Ambulatory Visit
Admission: RE | Admit: 2015-05-28 | Discharge: 2015-05-28 | Disposition: A | Discharge status: Home / Self Care | Payer: Managed Care, Other (non HMO) | Source: Ambulatory Visit | Attending: Physician Assistant | Admitting: Physician Assistant

## 2015-05-28 ENCOUNTER — Other Ambulatory Visit: Payer: Self-pay | Admitting: Physician Assistant

## 2015-05-28 DIAGNOSIS — J208 Acute bronchitis due to other specified organisms: Secondary | ICD-10-CM

## 2015-05-28 DIAGNOSIS — R05 Cough: Secondary | ICD-10-CM

## 2015-05-28 DIAGNOSIS — R059 Cough, unspecified: Secondary | ICD-10-CM

## 2016-02-07 ENCOUNTER — Other Ambulatory Visit: Payer: Self-pay | Admitting: Physician Assistant

## 2016-02-07 ENCOUNTER — Ambulatory Visit
Admission: RE | Admit: 2016-02-07 | Discharge: 2016-02-07 | Disposition: A | Discharge status: Home / Self Care | Payer: Managed Care, Other (non HMO) | Source: Ambulatory Visit | Attending: Physician Assistant | Admitting: Physician Assistant

## 2016-02-07 DIAGNOSIS — R059 Cough, unspecified: Secondary | ICD-10-CM

## 2016-02-07 DIAGNOSIS — R05 Cough: Secondary | ICD-10-CM

## 2016-02-23 ENCOUNTER — Other Ambulatory Visit: Payer: Self-pay | Admitting: Family Medicine

## 2016-02-23 DIAGNOSIS — Z1231 Encounter for screening mammogram for malignant neoplasm of breast: Secondary | ICD-10-CM

## 2016-03-28 ENCOUNTER — Ambulatory Visit
Admission: RE | Admit: 2016-03-28 | Discharge: 2016-03-28 | Disposition: A | Discharge status: Home / Self Care | Payer: Managed Care, Other (non HMO) | Source: Ambulatory Visit | Attending: Family Medicine | Admitting: Family Medicine

## 2016-03-28 DIAGNOSIS — Z1231 Encounter for screening mammogram for malignant neoplasm of breast: Secondary | ICD-10-CM

## 2017-03-13 ENCOUNTER — Other Ambulatory Visit: Payer: Self-pay | Admitting: Family Medicine

## 2017-03-13 DIAGNOSIS — Z1231 Encounter for screening mammogram for malignant neoplasm of breast: Secondary | ICD-10-CM

## 2017-04-03 ENCOUNTER — Ambulatory Visit
Admission: RE | Admit: 2017-04-03 | Discharge: 2017-04-03 | Disposition: A | Discharge status: Home / Self Care | Payer: Managed Care, Other (non HMO) | Source: Ambulatory Visit | Attending: Family Medicine | Admitting: Family Medicine

## 2017-04-03 DIAGNOSIS — Z1231 Encounter for screening mammogram for malignant neoplasm of breast: Secondary | ICD-10-CM

## 2017-06-19 DIAGNOSIS — K219 Gastro-esophageal reflux disease without esophagitis: Secondary | ICD-10-CM | POA: Insufficient documentation

## 2017-07-03 DIAGNOSIS — D649 Anemia, unspecified: Secondary | ICD-10-CM | POA: Insufficient documentation

## 2017-07-03 DIAGNOSIS — D696 Thrombocytopenia, unspecified: Secondary | ICD-10-CM | POA: Insufficient documentation

## 2017-08-08 ENCOUNTER — Other Ambulatory Visit: Payer: Self-pay | Admitting: Nurse Practitioner

## 2017-08-08 ENCOUNTER — Ambulatory Visit
Admission: RE | Admit: 2017-08-08 | Discharge: 2017-08-08 | Disposition: A | Discharge status: Home / Self Care | Payer: 59 | Source: Ambulatory Visit | Attending: Nurse Practitioner | Admitting: Nurse Practitioner

## 2017-08-08 DIAGNOSIS — J40 Bronchitis, not specified as acute or chronic: Secondary | ICD-10-CM

## 2017-09-10 ENCOUNTER — Ambulatory Visit
Admission: RE | Admit: 2017-09-10 | Discharge: 2017-09-10 | Disposition: A | Discharge status: Home / Self Care | Payer: 59 | Source: Ambulatory Visit | Attending: Nurse Practitioner | Admitting: Nurse Practitioner

## 2017-09-10 ENCOUNTER — Other Ambulatory Visit: Payer: Self-pay | Admitting: Nurse Practitioner

## 2017-09-10 DIAGNOSIS — J189 Pneumonia, unspecified organism: Secondary | ICD-10-CM

## 2018-03-07 ENCOUNTER — Other Ambulatory Visit: Payer: Self-pay | Admitting: Otolaryngology

## 2018-03-07 ENCOUNTER — Telehealth: Payer: Self-pay

## 2018-03-07 DIAGNOSIS — D37030 Neoplasm of uncertain behavior of the parotid salivary glands: Secondary | ICD-10-CM

## 2018-03-07 DIAGNOSIS — F419 Anxiety disorder, unspecified: Secondary | ICD-10-CM | POA: Insufficient documentation

## 2018-03-07 DIAGNOSIS — E0500 Thyrotoxicosis with diffuse goiter without thyrotoxic crisis or storm: Secondary | ICD-10-CM | POA: Insufficient documentation

## 2018-03-07 NOTE — Telephone Encounter (Signed)
I spoke with Juliauna to let her know I just phoned her 13-hr prep in to her [facility] in [place].  Prednisone 50mg  PO 03/13/18 @ 2000, 03/14/18 @ 0200 and 0800.  Benadryl 50mg  PO 03/14/18 @ 0800.  Gypsy Lore, RN

## 2018-03-14 ENCOUNTER — Ambulatory Visit
Admission: RE | Admit: 2018-03-14 | Discharge: 2018-03-14 | Disposition: A | Discharge status: Home / Self Care | Payer: 59 | Source: Ambulatory Visit | Attending: Otolaryngology | Admitting: Otolaryngology

## 2018-03-14 DIAGNOSIS — D37030 Neoplasm of uncertain behavior of the parotid salivary glands: Secondary | ICD-10-CM

## 2018-03-14 MED ORDER — IOPAMIDOL (ISOVUE-300) INJECTION 61%
75.0000 mL | Freq: Once | INTRAVENOUS | Status: AC | PRN
  Administered 2018-03-14: 75 mL via INTRAVENOUS

## 2018-04-24 ENCOUNTER — Other Ambulatory Visit: Payer: Self-pay | Admitting: Family Medicine

## 2018-04-24 DIAGNOSIS — Z1231 Encounter for screening mammogram for malignant neoplasm of breast: Secondary | ICD-10-CM

## 2018-06-06 ENCOUNTER — Ambulatory Visit
Admission: RE | Admit: 2018-06-06 | Discharge: 2018-06-06 | Disposition: A | Discharge status: Home / Self Care | Payer: 59 | Source: Ambulatory Visit | Attending: Family Medicine | Admitting: Family Medicine

## 2018-06-06 DIAGNOSIS — Z1231 Encounter for screening mammogram for malignant neoplasm of breast: Secondary | ICD-10-CM

## 2019-04-28 ENCOUNTER — Other Ambulatory Visit: Payer: Self-pay | Admitting: Family Medicine

## 2019-04-28 DIAGNOSIS — Z1231 Encounter for screening mammogram for malignant neoplasm of breast: Secondary | ICD-10-CM

## 2019-06-17 ENCOUNTER — Ambulatory Visit
Admission: RE | Admit: 2019-06-17 | Discharge: 2019-06-17 | Disposition: A | Discharge status: Home / Self Care | Payer: BC Managed Care – PPO | Source: Ambulatory Visit | Attending: Family Medicine | Admitting: Family Medicine

## 2019-06-17 ENCOUNTER — Other Ambulatory Visit: Payer: Self-pay

## 2019-06-17 DIAGNOSIS — Z1231 Encounter for screening mammogram for malignant neoplasm of breast: Secondary | ICD-10-CM

## 2019-12-15 ENCOUNTER — Inpatient Hospital Stay (HOSPITAL_COMMUNITY)
Admission: EM | Admit: 2019-12-15 | Discharge: 2019-12-20 | DRG: 177 | Disposition: A | Discharge status: Home / Self Care | Payer: BC Managed Care – PPO | Attending: Internal Medicine | Admitting: Internal Medicine

## 2019-12-15 ENCOUNTER — Emergency Department (HOSPITAL_COMMUNITY): Payer: BC Managed Care – PPO

## 2019-12-15 DIAGNOSIS — Z806 Family history of leukemia: Secondary | ICD-10-CM | POA: Diagnosis not present

## 2019-12-15 DIAGNOSIS — E039 Hypothyroidism, unspecified: Secondary | ICD-10-CM | POA: Diagnosis present

## 2019-12-15 DIAGNOSIS — U071 COVID-19: Principal | ICD-10-CM | POA: Diagnosis present

## 2019-12-15 DIAGNOSIS — D649 Anemia, unspecified: Secondary | ICD-10-CM | POA: Diagnosis present

## 2019-12-15 DIAGNOSIS — J9601 Acute respiratory failure with hypoxia: Secondary | ICD-10-CM | POA: Diagnosis present

## 2019-12-15 DIAGNOSIS — E860 Dehydration: Secondary | ICD-10-CM | POA: Diagnosis present

## 2019-12-15 DIAGNOSIS — E669 Obesity, unspecified: Secondary | ICD-10-CM | POA: Diagnosis present

## 2019-12-15 DIAGNOSIS — E079 Disorder of thyroid, unspecified: Secondary | ICD-10-CM | POA: Diagnosis present

## 2019-12-15 DIAGNOSIS — Z8249 Family history of ischemic heart disease and other diseases of the circulatory system: Secondary | ICD-10-CM | POA: Diagnosis not present

## 2019-12-15 DIAGNOSIS — Z79899 Other long term (current) drug therapy: Secondary | ICD-10-CM

## 2019-12-15 DIAGNOSIS — Z8041 Family history of malignant neoplasm of ovary: Secondary | ICD-10-CM

## 2019-12-15 DIAGNOSIS — Z7982 Long term (current) use of aspirin: Secondary | ICD-10-CM

## 2019-12-15 DIAGNOSIS — N179 Acute kidney failure, unspecified: Secondary | ICD-10-CM | POA: Diagnosis present

## 2019-12-15 DIAGNOSIS — R748 Abnormal levels of other serum enzymes: Secondary | ICD-10-CM | POA: Diagnosis present

## 2019-12-15 DIAGNOSIS — Z6841 Body Mass Index (BMI) 40.0 and over, adult: Secondary | ICD-10-CM | POA: Diagnosis not present

## 2019-12-15 DIAGNOSIS — I10 Essential (primary) hypertension: Secondary | ICD-10-CM | POA: Diagnosis present

## 2019-12-15 DIAGNOSIS — F419 Anxiety disorder, unspecified: Secondary | ICD-10-CM | POA: Diagnosis present

## 2019-12-15 DIAGNOSIS — Z90710 Acquired absence of both cervix and uterus: Secondary | ICD-10-CM

## 2019-12-15 DIAGNOSIS — J1282 Pneumonia due to coronavirus disease 2019: Secondary | ICD-10-CM | POA: Diagnosis present

## 2019-12-15 DIAGNOSIS — E119 Type 2 diabetes mellitus without complications: Secondary | ICD-10-CM | POA: Diagnosis present

## 2019-12-15 DIAGNOSIS — R0602 Shortness of breath: Secondary | ICD-10-CM | POA: Diagnosis present

## 2019-12-15 DIAGNOSIS — Z79890 Hormone replacement therapy (postmenopausal): Secondary | ICD-10-CM | POA: Diagnosis not present

## 2019-12-15 DIAGNOSIS — Z20822 Contact with and (suspected) exposure to covid-19: Secondary | ICD-10-CM | POA: Diagnosis present

## 2019-12-15 DIAGNOSIS — K219 Gastro-esophageal reflux disease without esophagitis: Secondary | ICD-10-CM | POA: Diagnosis present

## 2019-12-15 LAB — COMPREHENSIVE METABOLIC PANEL
ALT: 27 U/L (ref 0–44)
AST: 49 U/L — ABNORMAL HIGH (ref 15–41)
Albumin: 3 g/dL — ABNORMAL LOW (ref 3.5–5.0)
Alkaline Phosphatase: 53 U/L (ref 38–126)
Anion gap: 12 (ref 5–15)
BUN: 53 mg/dL — ABNORMAL HIGH (ref 8–23)
CO2: 24 mmol/L (ref 22–32)
Calcium: 8.2 mg/dL — ABNORMAL LOW (ref 8.9–10.3)
Chloride: 100 mmol/L (ref 98–111)
Creatinine, Ser: 2.27 mg/dL — ABNORMAL HIGH (ref 0.44–1.00)
GFR calc Af Amer: 25 mL/min — ABNORMAL LOW (ref >60)
GFR calc non Af Amer: 22 mL/min — ABNORMAL LOW (ref >60)
Glucose, Bld: 198 mg/dL — ABNORMAL HIGH (ref 70–99)
Potassium: 4.4 mmol/L (ref 3.5–5.1)
Sodium: 136 mmol/L (ref 135–145)
Total Bilirubin: 0.7 mg/dL (ref 0.3–1.2)
Total Protein: 7.2 g/dL (ref 6.5–8.1)

## 2019-12-15 LAB — CBC WITH DIFFERENTIAL/PLATELET
Abs Immature Granulocytes: 0.02 10*3/uL (ref 0.00–0.07)
Basophils Absolute: 0 10*3/uL (ref 0.0–0.1)
Basophils Relative: 0 %
Eosinophils Absolute: 0 10*3/uL (ref 0.0–0.5)
Eosinophils Relative: 0 %
HCT: 35.3 % — ABNORMAL LOW (ref 36.0–46.0)
Hemoglobin: 10.7 g/dL — ABNORMAL LOW (ref 12.0–15.0)
Immature Granulocytes: 0 %
Lymphocytes Relative: 15 %
Lymphs Abs: 1 10*3/uL (ref 0.7–4.0)
MCH: 27 pg (ref 26.0–34.0)
MCHC: 30.3 g/dL (ref 30.0–36.0)
MCV: 89.1 fL (ref 80.0–100.0)
Monocytes Absolute: 0.4 10*3/uL (ref 0.1–1.0)
Monocytes Relative: 7 %
Neutro Abs: 5 10*3/uL (ref 1.7–7.7)
Neutrophils Relative %: 78 %
Platelets: UNDETERMINED 10*3/uL (ref 150–400)
RBC: 3.96 MIL/uL (ref 3.87–5.11)
RDW: 16.3 % — ABNORMAL HIGH (ref 11.5–15.5)
WBC: 6.4 10*3/uL (ref 4.0–10.5)
nRBC: 0 % (ref 0.0–0.2)

## 2019-12-15 LAB — D-DIMER, QUANTITATIVE: D-Dimer, Quant: 1.48 ug/mL-FEU — ABNORMAL HIGH (ref 0.00–0.50)

## 2019-12-15 LAB — LACTATE DEHYDROGENASE: LDH: 475 U/L — ABNORMAL HIGH (ref 98–192)

## 2019-12-15 LAB — HEMOGLOBIN A1C
Hgb A1c MFr Bld: 7 % — ABNORMAL HIGH (ref 4.8–5.6)
Mean Plasma Glucose: 154.2 mg/dL

## 2019-12-15 LAB — HEPATITIS B SURFACE ANTIGEN: Hepatitis B Surface Ag: NONREACTIVE

## 2019-12-15 LAB — BRAIN NATRIURETIC PEPTIDE: B Natriuretic Peptide: 42.5 pg/mL (ref 0.0–100.0)

## 2019-12-15 LAB — LACTIC ACID, PLASMA
Lactic Acid, Venous: 0.9 mmol/L (ref 0.5–1.9)
Lactic Acid, Venous: 1.6 mmol/L (ref 0.5–1.9)

## 2019-12-15 LAB — FIBRINOGEN: Fibrinogen: 419 mg/dL (ref 210–475)

## 2019-12-15 LAB — C-REACTIVE PROTEIN: CRP: 15.9 mg/dL — ABNORMAL HIGH (ref <1.0)

## 2019-12-15 LAB — FERRITIN
Ferritin: 1053 ng/mL — ABNORMAL HIGH (ref 11–307)
Ferritin: 946 ng/mL — ABNORMAL HIGH (ref 11–307)

## 2019-12-15 LAB — TROPONIN I (HIGH SENSITIVITY)
Troponin I (High Sensitivity): 39 ng/L — ABNORMAL HIGH (ref <18)
Troponin I (High Sensitivity): 47 ng/L — ABNORMAL HIGH (ref <18)

## 2019-12-15 LAB — IRON AND TIBC
Iron: 17 ug/dL — ABNORMAL LOW (ref 28–170)
Saturation Ratios: 7 % — ABNORMAL LOW (ref 10.4–31.8)
TIBC: 230 ug/dL — ABNORMAL LOW (ref 250–450)
UIBC: 213 ug/dL

## 2019-12-15 LAB — HIV ANTIBODY (ROUTINE TESTING W REFLEX): HIV Screen 4th Generation wRfx: NONREACTIVE

## 2019-12-15 LAB — TSH: TSH: 2.155 u[IU]/mL (ref 0.350–4.500)

## 2019-12-15 LAB — ABO/RH: ABO/RH(D): O POS

## 2019-12-15 LAB — PROCALCITONIN: Procalcitonin: <0.1 ng/mL

## 2019-12-15 LAB — CBG MONITORING, ED: Glucose-Capillary: 200 mg/dL — ABNORMAL HIGH (ref 70–99)

## 2019-12-15 LAB — SARS CORONAVIRUS 2 BY RT PCR (HOSPITAL ORDER, PERFORMED IN ~~LOC~~ HOSPITAL LAB): SARS Coronavirus 2: POSITIVE — AB

## 2019-12-15 MED ORDER — ONDANSETRON HCL 4 MG/2ML IJ SOLN: 4.0000 mg | Freq: Four times a day (QID) | INTRAMUSCULAR | Status: DC | PRN

## 2019-12-15 MED ORDER — DEXAMETHASONE SODIUM PHOSPHATE 10 MG/ML IJ SOLN
6.0000 mg | INTRAMUSCULAR | Status: DC
  Administered 2019-12-15 – 2019-12-20 (×6): 6 mg via INTRAVENOUS
  Filled 2019-12-15 (×6): qty 1

## 2019-12-15 MED ORDER — GUAIFENESIN-DM 100-10 MG/5ML PO SYRP
10.0000 mL | ORAL_SOLUTION | ORAL | Status: DC | PRN
  Administered 2019-12-15 – 2019-12-19 (×5): 10 mL via ORAL
  Filled 2019-12-15 (×5): qty 10

## 2019-12-15 MED ORDER — SODIUM CHLORIDE 0.9 % IV SOLN: INTRAVENOUS | Status: DC

## 2019-12-15 MED ORDER — ZINC SULFATE 220 (50 ZN) MG PO CAPS
220.0000 mg | ORAL_CAPSULE | Freq: Every day | ORAL | Status: DC
  Administered 2019-12-15 – 2019-12-20 (×7): 220 mg via ORAL
  Filled 2019-12-15 (×7): qty 1

## 2019-12-15 MED ORDER — SODIUM CHLORIDE 0.9% FLUSH
3.0000 mL | Freq: Once | INTRAVENOUS | Status: AC
  Administered 2019-12-15: 3 mL via INTRAVENOUS

## 2019-12-15 MED ORDER — ONDANSETRON HCL 4 MG PO TABS: 4.0000 mg | ORAL_TABLET | Freq: Four times a day (QID) | ORAL | Status: DC | PRN

## 2019-12-15 MED ORDER — ASCORBIC ACID 500 MG PO TABS
500.0000 mg | ORAL_TABLET | Freq: Every day | ORAL | Status: DC
  Administered 2019-12-15 – 2019-12-20 (×7): 500 mg via ORAL
  Filled 2019-12-15 (×7): qty 1

## 2019-12-15 MED ORDER — SODIUM CHLORIDE 0.9 % IV SOLN
200.0000 mg | Freq: Once | INTRAVENOUS | Status: AC
  Administered 2019-12-15: 200 mg via INTRAVENOUS
  Filled 2019-12-15: qty 40

## 2019-12-15 MED ORDER — HYDROCOD POLST-CPM POLST ER 10-8 MG/5ML PO SUER
5.0000 mL | Freq: Two times a day (BID) | ORAL | Status: DC | PRN
  Administered 2019-12-16 – 2019-12-19 (×3): 5 mL via ORAL
  Filled 2019-12-15 (×4): qty 5

## 2019-12-15 MED ORDER — INSULIN ASPART 100 UNIT/ML ~~LOC~~ SOLN
0.0000 [IU] | Freq: Every day | SUBCUTANEOUS | Status: DC
  Administered 2019-12-16: 3 [IU] via SUBCUTANEOUS
  Administered 2019-12-17 – 2019-12-18 (×2): 2 [IU] via SUBCUTANEOUS

## 2019-12-15 MED ORDER — ALBUTEROL SULFATE HFA 108 (90 BASE) MCG/ACT IN AERS
2.0000 | INHALATION_SPRAY | Freq: Four times a day (QID) | RESPIRATORY_TRACT | Status: DC
  Administered 2019-12-15 – 2019-12-20 (×20): 2 via RESPIRATORY_TRACT
  Filled 2019-12-15 (×2): qty 6.7

## 2019-12-15 MED ORDER — LOPERAMIDE HCL 2 MG PO CAPS
4.0000 mg | ORAL_CAPSULE | ORAL | Status: DC | PRN
  Administered 2019-12-15: 4 mg via ORAL
  Filled 2019-12-15: qty 2

## 2019-12-15 MED ORDER — ENOXAPARIN SODIUM 40 MG/0.4ML ~~LOC~~ SOLN
40.0000 mg | SUBCUTANEOUS | Status: DC
  Administered 2019-12-15: 40 mg via SUBCUTANEOUS
  Filled 2019-12-15: qty 0.4

## 2019-12-15 MED ORDER — INSULIN ASPART 100 UNIT/ML ~~LOC~~ SOLN
0.0000 [IU] | Freq: Three times a day (TID) | SUBCUTANEOUS | Status: DC
  Administered 2019-12-16 (×3): 11 [IU] via SUBCUTANEOUS
  Administered 2019-12-17: 8 [IU] via SUBCUTANEOUS
  Administered 2019-12-17: 11 [IU] via SUBCUTANEOUS
  Administered 2019-12-17: 8 [IU] via SUBCUTANEOUS
  Administered 2019-12-18: 11 [IU] via SUBCUTANEOUS
  Administered 2019-12-18 (×2): 8 [IU] via SUBCUTANEOUS
  Administered 2019-12-19: 5 [IU] via SUBCUTANEOUS
  Administered 2019-12-19 – 2019-12-20 (×3): 3 [IU] via SUBCUTANEOUS

## 2019-12-15 MED ORDER — SODIUM CHLORIDE 0.9 % IV SOLN
100.0000 mg | Freq: Every day | INTRAVENOUS | Status: AC
  Administered 2019-12-16 – 2019-12-19 (×4): 100 mg via INTRAVENOUS
  Filled 2019-12-15 (×6): qty 20

## 2019-12-15 MED ORDER — ACETAMINOPHEN 325 MG PO TABS
650.0000 mg | ORAL_TABLET | Freq: Four times a day (QID) | ORAL | Status: DC | PRN
  Filled 2019-12-15: qty 2

## 2019-12-15 MED ORDER — LACTATED RINGERS IV BOLUS
1000.0000 mL | Freq: Once | INTRAVENOUS | Status: AC
  Administered 2019-12-15: 1000 mL via INTRAVENOUS

## 2019-12-15 NOTE — ED Notes (Signed)
Pt had another episode of diarrhea. Pt cleaned.

## 2019-12-15 NOTE — H&P (Signed)
History and Physical    Laura [name] TGP:[id number] DOB: 02/23/1951 DOA: 12/15/2019  PCP: Linda Hedges, DO  Patient coming from: home  I have personally briefly reviewed patient's old medical records in [place]  Chief Complaint: Worsening cough, shortness of breath and diarrhea since 1 week.  HPI: Laura [name] is a 69 y.o. female with medical history significant of hypertension, GERD, hypothyroidism, diabetes mellitus, obesity presents to emergency department with worsening cough, shortness of breath and diarrhea since 1 week.  Patient tells me that she tested positive for COVID-19 6 days ago.  She continues to have worsening of her symptoms including cough, shortness of breath, diarrhea.  She reports nonbloody diarrhea 4-5 episodes per day, associated with decreased appetite, generalized weakness, lethargy.  Reports chills, nausea and headache however denies lightheadedness, dizziness, syncope, chest pain, wheezing, abdominal pain, urinary symptoms.  She denies recent COVID-19 exposure.  She did not receive COVID-19 vaccine.  Lives with her husband at home.  No history of smoking, alcohol, illicit drug use.  She uses cane for ambulation.  She does not use oxygen at home.  ED Course: Upon arrival to ED: Patient tachypneic, requiring 2 L of oxygen via nasal cannula, afebrile with no leukocytosis.  CMP shows AKI.  Chest x-ray shows multifocal pneumonia.  [facility] hospitalist consulted for admission for Covid pneumonia.  Review of Systems: As per HPI otherwise negative.    Past Medical History:  Diagnosis Date  . Anxiety   . Diabetes mellitus without complication ([place])   . GERD (gastroesophageal reflux disease)   . Hypertension   . Thyroid disease     Past Surgical History:  Procedure Laterality Date  . ABDOMINAL HYSTERECTOMY    . BREAST SURGERY     breast mass  . DILATION AND CURETTAGE OF UTERUS    . FOOT SURGERY       reports that she has never smoked. She does not  have any smokeless tobacco history on file. She reports that she does not drink alcohol and does not use drugs.  Allergies  Allergen Reactions  . Cytomel [Liothyronine] Hives  . Fluconazole Palpitations  . Iohexol Hives     Code: HIVES, Desc: HIVES S/P CONTRAST INJECTION//A.C., Onset Date: [id number]   . Ivp Dye [Iodinated Diagnostic Agents] Hives  . Aleve [Naproxen Sodium]   . Allegra [Fexofenadine Hcl]   . Avapro [Irbesartan]   . Brilliant Blue Fcf   . Cephalosporins   . Januvia [Sitagliptin]   . Kombiglyze [Saxagliptin-Metformin Er]   . Lotrel [Amlodipine Besy-Benazepril Hcl]   . Metformin And Related   . Sulfa Antibiotics   . Toviaz [Fesoterodine Fumarate Er]     Family History  Problem Relation Age of Onset  . Hypertension Sister   . Ovarian cancer Daughter   . Leukemia Brother   . Thyroid disease Daughter   . Liver disease Father   . Prostate cancer Other     Prior to Admission medications   Medication Sig Start Date End Date Taking? Authorizing Provider  aspirin 81 MG tablet Take 81 mg by mouth daily.    [provider]  Cholecalciferol (VITAMIN D PO) Take by mouth.    [provider]  dexlansoprazole (DEXILANT) 60 MG capsule Take 60 mg by mouth daily.    [provider]  diazepam (VALIUM) 5 MG tablet Take 5 mg by mouth every 6 (six) hours as needed.    [provider]  estradiol (CLIMARA - DOSED IN MG/24 HR)  0.05 mg/24hr Place 1 patch onto the skin once a week.    [provider]  Glucosamine-Chondroit-Vit C-Mn (GLUCOSAMINE 1500 COMPLEX PO) Take by mouth.    [provider]  levothyroxine (SYNTHROID, LEVOTHROID) 175 MCG tablet Take 175 mcg by mouth daily.    [provider]  losartan (COZAAR) 50 MG tablet Take 50 mg by mouth daily.    [provider]  metoprolol succinate (TOPROL-XL) 100 MG 24 hr tablet Take 100 mg by mouth daily. Take with or immediately following a meal.    [provider]  Multiple Vitamin (MULTI-VITAMIN DAILY PO) Take by mouth.    [provider]  pioglitazone-metformin (ACTOPLUS MET) 15-850 MG per tablet Take 1 tablet by mouth 2 (two) times daily with a meal.    [provider]  potassium chloride (KLOR-CON) 20 MEQ packet Take 20 mEq by mouth 2 (two) times daily.    [provider]  triamterene-hydrochlorothiazide (MAXZIDE-25) 37.5-25 MG per tablet Take 1 tablet by mouth daily.    [provider]    Physical Exam: Vitals:   12/15/19 1449 12/15/19 1621 12/15/19 1652 12/15/19 1653  BP: 101/81 (!) 115/53    Pulse: 89 92    Resp: (!) 24 (!) 24    Temp: 98.4 F (36.9 C) 99.6 F (37.6 C)    TempSrc: Oral Oral    SpO2: 93% (!) 88% 90% 96%    Constitutional: NAD, calm, comfortable, on 2 L of oxygen via nasal cannula, obese, communicating well, appears dehydrated Eyes: PERRL, lids and conjunctivae normal ENMT: Mucous membranes are dry. Posterior pharynx clear of any exudate or lesions.Normal dentition.  Neck: normal, supple, no masses, no thyromegaly Respiratory: clear to auscultation bilaterally, no wheezing, no crackles. Normal respiratory effort. No accessory muscle use.  Cardiovascular: Regular rate and rhythm, no murmurs / rubs / gallops. No extremity edema. 2+ pedal pulses. No carotid bruits.  Abdomen: no tenderness, no masses palpated. No hepatosplenomegaly. Bowel sounds positive.  Musculoskeletal: no clubbing / cyanosis. No joint deformity upper and lower extremities. Good ROM, no contractures. Normal muscle tone.  Skin: no rashes, lesions, ulcers. No induration Neurologic: CN 2-12 grossly intact. Sensation intact, DTR normal. Strength 5/5 in all 4.  Psychiatric: Normal judgment and insight. Alert and oriented x 3. Normal mood.    Labs on Admission: I have personally reviewed following labs and imaging studies  CBC: Recent Labs  Lab 12/15/19 1500  WBC 6.4  NEUTROABS 5.0  HGB 10.7*  HCT 35.3*  MCV 89.1    PLT PLATELET CLUMPS NOTED ON SMEAR, UNABLE TO ESTIMATE   Basic Metabolic Panel: Recent Labs  Lab 12/15/19 1500  NA 136  K 4.4  CL 100  CO2 24  GLUCOSE 198*  BUN 53*  CREATININE 2.27*  CALCIUM 8.2*   GFR: CrCl cannot be calculated (Unknown ideal weight.). Liver Function Tests: Recent Labs  Lab 12/15/19 1500  AST 49*  ALT 27  ALKPHOS 53  BILITOT 0.7  PROT 7.2  ALBUMIN 3.0*   No results for input(s): LIPASE, AMYLASE in the last 168 hours. No results for input(s): AMMONIA in the last 168 hours. Coagulation Profile: No results for input(s): INR, PROTIME in the last 168 hours. Cardiac Enzymes: No results for input(s): CKTOTAL, CKMB, CKMBINDEX, TROPONINI in the last 168 hours. BNP (last 3 results) No results for input(s): PROBNP in the last 8760 hours. HbA1C: No results for input(s): HGBA1C in the last 72 hours. CBG: No results for input(s): GLUCAP in  the last 168 hours. Lipid Profile: No results for input(s): CHOL, HDL, LDLCALC, TRIG, CHOLHDL, LDLDIRECT in the last 72 hours. Thyroid Function Tests: No results for input(s): TSH, T4TOTAL, FREET4, T3FREE, THYROIDAB in the last 72 hours. Anemia Panel: No results for input(s): VITAMINB12, FOLATE, FERRITIN, TIBC, IRON, RETICCTPCT in the last 72 hours. Urine analysis:    Component Value Date/Time   COLORURINE YELLOW 06/14/2007 [place] 06/14/2007 1041   LABSPEC 1.010 06/14/2007 1041   PHURINE 6.0 06/14/2007 1041   GLUCOSEU NEGATIVE 06/14/2007 1041   HGBUR NEGATIVE 06/14/2007 [place] 06/14/2007 [place] 06/14/2007 1041   PROTEINUR NEGATIVE 06/14/2007 1041   UROBILINOGEN 0.2 06/14/2007 1041   NITRITE NEGATIVE 06/14/2007 1041   LEUKOCYTESUR  06/14/2007 1041    NEGATIVE MICROSCOPIC NOT DONE ON URINES WITH NEGATIVE PROTEIN, BLOOD, LEUKOCYTES, NITRITE, OR GLUCOSE <1000 mg/dL.    Radiological Exams on Admission: DG Chest Portable 1 View  Result Date:  12/15/2019 CLINICAL DATA:  69 year old female with shortness of breath. Positive COVID-19. EXAM: PORTABLE CHEST 1 VIEW COMPARISON:  Chest radiograph dated 09/10/2017. FINDINGS: Bilateral faint streaky densities primarily involving the peripheral and subpleural region of the mid to lower lung field most consistent with pneumonia, likely viral or atypical in etiology. Clinical correlation and follow-up recommended. No lobar consolidation, pleural effusion, pneumothorax. Mild cardiomegaly. No acute osseous pathology. IMPRESSION: 1. Multifocal pneumonia, likely viral or atypical in etiology. Clinical correlation and follow-up recommended. 2. Mild cardiomegaly. Electronically Signed   By: Anner Crete M.D.   On: 12/15/2019 15:14   Assessment/Plan Principal Problem:   Acute hypoxemic respiratory failure (HCC) Active Problems:   Acquired hypothyroidism   Essential hypertension   Normocytic anemia   Diabetes mellitus without complication (HCC)   Pneumonia due to COVID-19 virus   AKI (acute kidney injury) ([place])   Acute hypoxemic respiratory failure due to COVID-19 [facility])    Acute hypoxemic respiratory failure secondary to underlying Covid pneumonia. -Patient tested positive for Covid 6 days ago.  Presented with worsening cough, shortness of breath and diarrhea.  She is afebrile, tachypneic, requiring 2 L of oxygen via nasal cannula. -Admit patient on the floor.  On continuous pulse ox.  We will try to wean off of oxygen as tolerated. -Start on Decadron and remdesivir as per pharmacy. -Albuterol breathing treatment, antitussive, p.o. vitamins. -Ordered blood culture, procalcitonin level, lactic acid, inflammatory markers.  Repeat inflammatory markers tomorrow AM. -Patient was told that if COVID-19 pneumonitis gets worse we might potentially use Actemra off label, she denies known history of tuberculosis or hepatitis and understands the risk and benefits and wants to proceed with Actemra if  required. -Monitor vitals closely.  Avoid NSAIDs.  Imodium as needed for diarrhea.  Hypertension: Blood pressure 101/81 upon arrival. -Hold losartan, triamterene-HCTZ and metoprolol for now.  Resume home BP meds once blood pressure is back to baseline.  AKI: Likely secondary to dehydration secondary to diarrhea -Creatinine: 2.27, GFR: 22 -Start with gentle hydration.  Avoid nephrotoxic medication.  Repeat BMP tomorrow a.m.  Elevated liver enzymes: AST: 49 -Likely secondary to Covid 19 infection.  Patient denies alcohol abuse -Repeat CMP tomorrow a.m.  Hypothyroidism: Check TSH.  Continue levothyroxine  Type 2 diabetes mellitus: Check A1c -Hold Actos-Metformin for now.  Started patient on sliding scale insulin and monitor blood sugar closely  Normocytic anemia: Check iron studies -Monitor H&H closely.  GERD: Continue PPI  Unable to safely start patient's home medication as med reconciliation is  pending by pharmacy.  DVT prophylaxis: Renal dose Lovenox/SCD Code Status: Full code Family Communication: None present at bedside.  Plan of care discussed with patient in length and she verbalized understanding and agreed with it. Disposition Plan: Home in 2 to 3 days Consults called: None Admission status: Inpatient   Mckinley Jewel MD [facility]  If 7PM-7AM, please contact night-coverage [email or web address] Password [facility]  12/15/2019, 5:48 PM

## 2019-12-15 NOTE — ED Notes (Signed)
Pt had episode of diarrhea. Pt cleaned and linens changed.

## 2019-12-15 NOTE — ED Triage Notes (Signed)
Pt dx with Covid last week. States cough, shob, fevers, fatigue and diarrhea is getting worse.

## 2019-12-16 ENCOUNTER — Other Ambulatory Visit: Payer: Self-pay

## 2019-12-16 DIAGNOSIS — E039 Hypothyroidism, unspecified: Secondary | ICD-10-CM

## 2019-12-16 DIAGNOSIS — E119 Type 2 diabetes mellitus without complications: Secondary | ICD-10-CM

## 2019-12-16 DIAGNOSIS — N179 Acute kidney failure, unspecified: Secondary | ICD-10-CM

## 2019-12-16 DIAGNOSIS — U071 COVID-19: Principal | ICD-10-CM

## 2019-12-16 LAB — CBC WITH DIFFERENTIAL/PLATELET
Abs Immature Granulocytes: 0.03 10*3/uL (ref 0.00–0.07)
Basophils Absolute: 0 10*3/uL (ref 0.0–0.1)
Basophils Relative: 0 %
Eosinophils Absolute: 0 10*3/uL (ref 0.0–0.5)
Eosinophils Relative: 0 %
HCT: 31.8 % — ABNORMAL LOW (ref 36.0–46.0)
Hemoglobin: 9.6 g/dL — ABNORMAL LOW (ref 12.0–15.0)
Immature Granulocytes: 1 %
Lymphocytes Relative: 10 %
Lymphs Abs: 0.5 10*3/uL — ABNORMAL LOW (ref 0.7–4.0)
MCH: 26.4 pg (ref 26.0–34.0)
MCHC: 30.2 g/dL (ref 30.0–36.0)
MCV: 87.6 fL (ref 80.0–100.0)
Monocytes Absolute: 0.1 10*3/uL (ref 0.1–1.0)
Monocytes Relative: 2 %
Neutro Abs: 4.5 10*3/uL (ref 1.7–7.7)
Neutrophils Relative %: 87 %
Platelets: UNDETERMINED 10*3/uL (ref 150–400)
RBC: 3.63 MIL/uL — ABNORMAL LOW (ref 3.87–5.11)
RDW: 16.1 % — ABNORMAL HIGH (ref 11.5–15.5)
WBC: 5.1 10*3/uL (ref 4.0–10.5)
nRBC: 0 % (ref 0.0–0.2)

## 2019-12-16 LAB — D-DIMER, QUANTITATIVE: D-Dimer, Quant: 1.29 ug/mL-FEU — ABNORMAL HIGH (ref 0.00–0.50)

## 2019-12-16 LAB — GLUCOSE, CAPILLARY
Glucose-Capillary: 274 mg/dL — ABNORMAL HIGH (ref 70–99)
Glucose-Capillary: 319 mg/dL — ABNORMAL HIGH (ref 70–99)
Glucose-Capillary: 329 mg/dL — ABNORMAL HIGH (ref 70–99)
Glucose-Capillary: 308 mg/dL — ABNORMAL HIGH (ref 70–99)
Glucose-Capillary: 251 mg/dL — ABNORMAL HIGH (ref 70–99)

## 2019-12-16 LAB — COMPREHENSIVE METABOLIC PANEL
ALT: 27 U/L (ref 0–44)
AST: 41 U/L (ref 15–41)
Albumin: 2.6 g/dL — ABNORMAL LOW (ref 3.5–5.0)
Alkaline Phosphatase: 53 U/L (ref 38–126)
Anion gap: 12 (ref 5–15)
BUN: 51 mg/dL — ABNORMAL HIGH (ref 8–23)
CO2: 21 mmol/L — ABNORMAL LOW (ref 22–32)
Calcium: 7.8 mg/dL — ABNORMAL LOW (ref 8.9–10.3)
Chloride: 104 mmol/L (ref 98–111)
Creatinine, Ser: 1.7 mg/dL — ABNORMAL HIGH (ref 0.44–1.00)
GFR calc Af Amer: 36 mL/min — ABNORMAL LOW (ref >60)
GFR calc non Af Amer: 31 mL/min — ABNORMAL LOW (ref >60)
Glucose, Bld: 341 mg/dL — ABNORMAL HIGH (ref 70–99)
Potassium: 4.7 mmol/L (ref 3.5–5.1)
Sodium: 137 mmol/L (ref 135–145)
Total Bilirubin: 0.7 mg/dL (ref 0.3–1.2)
Total Protein: 6.2 g/dL — ABNORMAL LOW (ref 6.5–8.1)

## 2019-12-16 LAB — BRAIN NATRIURETIC PEPTIDE: B Natriuretic Peptide: 43.8 pg/mL (ref 0.0–100.0)

## 2019-12-16 LAB — PHOSPHORUS: Phosphorus: 4.1 mg/dL (ref 2.5–4.6)

## 2019-12-16 LAB — MAGNESIUM: Magnesium: 1.9 mg/dL (ref 1.7–2.4)

## 2019-12-16 LAB — C-REACTIVE PROTEIN: CRP: 14.8 mg/dL — ABNORMAL HIGH (ref <1.0)

## 2019-12-16 MED ORDER — POTASSIUM CHLORIDE CRYS ER 20 MEQ PO TBCR: 20.0000 meq | EXTENDED_RELEASE_TABLET | Freq: Every day | ORAL | Status: DC

## 2019-12-16 MED ORDER — INSULIN GLARGINE 100 UNIT/ML ~~LOC~~ SOLN
30.0000 [IU] | Freq: Two times a day (BID) | SUBCUTANEOUS | Status: DC
  Administered 2019-12-16 – 2019-12-17 (×3): 30 [IU] via SUBCUTANEOUS
  Filled 2019-12-16 (×5): qty 0.3

## 2019-12-16 MED ORDER — LACTATED RINGERS IV SOLN: INTRAVENOUS | Status: AC

## 2019-12-16 MED ORDER — SODIUM CHLORIDE 0.9% FLUSH
10.0000 mL | Freq: Two times a day (BID) | INTRAVENOUS | Status: DC
  Administered 2019-12-16 – 2019-12-20 (×8): 10 mL

## 2019-12-16 MED ORDER — PANTOPRAZOLE SODIUM 40 MG PO TBEC
40.0000 mg | DELAYED_RELEASE_TABLET | Freq: Every day | ORAL | Status: DC
  Administered 2019-12-16 – 2019-12-20 (×5): 40 mg via ORAL
  Filled 2019-12-16 (×5): qty 1

## 2019-12-16 MED ORDER — VITAMIN D 25 MCG (1000 UNIT) PO TABS
1000.0000 [IU] | ORAL_TABLET | Freq: Every day | ORAL | Status: DC
  Administered 2019-12-16 – 2019-12-20 (×5): 1000 [IU] via ORAL
  Filled 2019-12-16 (×5): qty 1

## 2019-12-16 MED ORDER — ASPIRIN 81 MG PO CHEW
81.0000 mg | CHEWABLE_TABLET | Freq: Every day | ORAL | Status: DC
  Administered 2019-12-16 – 2019-12-20 (×5): 81 mg via ORAL
  Filled 2019-12-16 (×5): qty 1

## 2019-12-16 MED ORDER — SODIUM CHLORIDE 0.9% FLUSH: 10.0000 mL | INTRAVENOUS | Status: DC | PRN

## 2019-12-16 MED ORDER — INSULIN GLARGINE 100 UNIT/ML ~~LOC~~ SOLN
30.0000 [IU] | Freq: Every day | SUBCUTANEOUS | Status: DC
  Administered 2019-12-16: 30 [IU] via SUBCUTANEOUS
  Filled 2019-12-16: qty 0.3

## 2019-12-16 MED ORDER — LEVOTHYROXINE SODIUM 75 MCG PO TABS
225.0000 ug | ORAL_TABLET | Freq: Every day | ORAL | Status: DC
  Administered 2019-12-16 – 2019-12-20 (×5): 225 ug via ORAL
  Filled 2019-12-16 (×5): qty 3

## 2019-12-16 MED ORDER — INSULIN GLARGINE 100 UNIT/ML ~~LOC~~ SOLN: 20.0000 [IU] | Freq: Every day | SUBCUTANEOUS | Status: DC

## 2019-12-16 MED ORDER — TRIAMTERENE-HCTZ 37.5-25 MG PO CAPS: 1.0000 | ORAL_CAPSULE | Freq: Every day | ORAL | Status: DC

## 2019-12-16 MED ORDER — INSULIN ASPART 100 UNIT/ML ~~LOC~~ SOLN
3.0000 [IU] | Freq: Three times a day (TID) | SUBCUTANEOUS | Status: DC
  Administered 2019-12-16 – 2019-12-19 (×10): 3 [IU] via SUBCUTANEOUS

## 2019-12-16 MED ORDER — ENOXAPARIN SODIUM 60 MG/0.6ML ~~LOC~~ SOLN
60.0000 mg | SUBCUTANEOUS | Status: DC
  Administered 2019-12-16 – 2019-12-20 (×5): 60 mg via SUBCUTANEOUS
  Filled 2019-12-16 (×5): qty 0.6

## 2019-12-16 MED ORDER — METOPROLOL SUCCINATE ER 100 MG PO TB24
100.0000 mg | ORAL_TABLET | Freq: Every day | ORAL | Status: DC
  Administered 2019-12-16 – 2019-12-20 (×5): 100 mg via ORAL
  Filled 2019-12-16 (×5): qty 1

## 2019-12-16 NOTE — Progress Notes (Signed)
ANTICOAGULATION CONSULT NOTE - Initial Consult  Pharmacy Consult for enoxaparin Indication: VTE prophylaxis  Allergies  Allergen Reactions  . Fluconazole Palpitations  . Kombiglyze [Saxagliptin-Metformin Er] Shortness Of Breath, Palpitations and Other (See Comments)    "My heart stops beating"  . Onglyza [Saxagliptin] Shortness Of Breath and Other (See Comments)    "MY heart stops beating"  . Toviaz [Fesoterodine] Shortness Of Breath  . Victoza [Liraglutide] Shortness Of Breath  . Augmentin [Amoxicillin-Pot Clavulanate] Diarrhea and Nausea And Vomiting  . Brilliant Blue Fcf Hives  . Cephalosporins Nausea And Vomiting  . Cytomel [Liothyronine] Hives  . Iohexol Hives and Other (See Comments)    HIVES S/P CONTRAST INJECTION//A.C., Onset Date: 16109604  . Ivp Dye [Iodinated Diagnostic Agents] Hives and Other (See Comments)    And bad sweating  . Allegra [Fexofenadine Hcl] Other (See Comments)    "Bad stomach cramps"  . Avapro [Irbesartan] Other (See Comments)    "Stayed really sick"  . Cephalexin Other (See Comments)    "Makes me really sick"  . Januvia [Sitagliptin] Other (See Comments)    Ineffective   . Lotrel [Amlodipine Besy-Benazepril Hcl] Other (See Comments)    "Could not hold up my head"  . Metformin Other (See Comments)    Reaction not noted by patient   . Metformin And Related Diarrhea  . Ozempic (0.25 Or 0.5 Mg-Dose) [Semaglutide(0.25 Or 0.5mg -Dos)] Diarrhea and Other (See Comments)    Severe diarrhea  . Pioglitazone Other (See Comments)    Reaction not noted by patient- patient taking in 2021  . Naproxen Sodium Palpitations  . Semaglutide Diarrhea  . Sulfa Antibiotics Palpitations    Patient Measurements: Height: 5\' 3"  (160 cm) Weight: (!) 121.2 kg (267 lb 3.2 oz) IBW/kg (Calculated) : 52.4  Vital Signs: Temp: 97.5 F (36.4 C) (07/27 0735) Temp Source: Oral (07/27 0735) BP: 134/69 (07/27 0735) Pulse Rate: 56 (07/27 0735)  Labs: Recent Labs     12/15/19 1500 12/15/19 1757 12/15/19 2146 12/16/19 0450  HGB 10.7*  --   --  9.6*  HCT 35.3*  --   --  31.8*  PLT PLATELET CLUMPS NOTED ON SMEAR, UNABLE TO ESTIMATE  --   --  PLATELET CLUMPS NOTED ON SMEAR, UNABLE TO ESTIMATE  CREATININE 2.27*  --   --  1.70*  TROPONINIHS  --  39* 47*  --     Estimated Creatinine Clearance: 40.5 mL/min (A) (by C-G formula based on SCr of 1.7 mg/dL (H)).   Medical History: Past Medical History:  Diagnosis Date  . Anxiety   . Diabetes mellitus without complication ([place])   . GERD (gastroesophageal reflux disease)   . Hypertension   . Thyroid disease     Medications:  Infusions:  . sodium chloride 75 mL/hr at 12/16/19 0654  . remdesivir 100 mg in NS 100 mL      Assessment: Patient is COVID+ and required oxygen on admission.  TBW: 121.2kg, IBW: 52.4kg, BMI 47.3.  AKI is improving (SCr 1.7<<2.27, BL ~0.8), CrCl now 40.41mL/min.   Hgb 9.6, HCT 31.8, lab unable to read PLT. No s/sx bleeding noted. Received enoxaparin 40mg  yesterday at ~1900.  She qualifies for increased enoxaparin dosing at 0.5mg /kg per day for COVID+ patients. Will increase dose and begin today.  Goal of Therapy:  Monitor platelets by anticoagulation protocol: Yes   Plan:  Lovenox 60mg  SQ daily Monitor PLT, H/H, s/sx bleeding  Mercy Riding, PharmD PGY1 Acute Care Pharmacy Resident Please refer to [facility] for unit-specific  pharmacist

## 2019-12-16 NOTE — Progress Notes (Signed)
PROGRESS NOTE                                                                                                                                                                                                             Patient Demographics:    Laura [name], is a 69 y.o. female, DOB - Aug 19, 1950, YIA:[id number]  Outpatient Primary MD for the patient is Laura Noon, MD    LOS - 1  Admit date - 12/15/2019    Chief Complaint  Patient presents with   Shortness of Breath   Fatigue       Brief Narrative - Laura [name] is a 69 y.o. female with medical history significant of hypertension, GERD, hypothyroidism, diabetes mellitus, obesity presents to emergency department with worsening cough, shortness of breath and diarrhea since 1 week, he was diagnosed with COVID-19 infection about a week before ER visit.  In the ER she was diagnosed with COVID-19 pneumonia and admitted to the hospital.   Subjective:    Laura [name] has, No headache, No chest pain, No abdominal pain - No Nausea, No new weakness tingling or numbness, improved diarrhea and shortness of breath.   Assessment  & Plan :      1. Acute Hypoxic Resp. Failure due to Acute Covid 19 Viral Pneumonitis during the ongoing 2020 Covid 19 Pandemic - she has moderate disease, has been started on IV steroids and Remdesivir will be monitored.  She has consented to Actemra use if needed, if clinically she gets worse Actemra will be used.  Encouraged the patient to sit up in chair in the daytime use I-S and flutter valve for pulmonary toiletry and then prone in bed when at night.  Will advance activity and titrate down oxygen as possible.  Actemra off label use - patient was told that if COVID-19 pneumonitis gets worse we might potentially use Actemra off label, patient denies any known history of active diverticulitis, tuberculosis or hepatitis, understands the risks and  benefits and wants to proceed with Actemra treatment if required.    SpO2: 98 % O2 Flow Rate (L/min): 1 L/min  Recent Labs  Lab 12/15/19 1500 12/15/19 1757 12/15/19 1822 12/16/19 0450  WBC 6.4  --   --  5.1  PLT PLATELET CLUMPS NOTED ON SMEAR, UNABLE TO ESTIMATE  --   --  PLATELET CLUMPS NOTED ON SMEAR, UNABLE TO ESTIMATE  CRP  --  15.9*  --  14.8*  DDIMER  --  1.48*  --  1.29*  PROCALCITON  --  <0.10  --   --   LATICACIDVEN 0.9  --  1.6  --   SARSCOV2NAA  --  POSITIVE*  --   --     Hepatic Function Latest Ref Rng & Units 12/16/2019 12/15/2019 06/14/2007  Total Protein 6.5 - 8.1 g/dL 6.2(L) 7.2 6.6  Albumin 3.5 - 5.0 g/dL 2.6(L) 3.0(L) 3.1(L)  AST 15 - 41 U/L 41 49(H) 18  ALT 0 - 44 U/L 27 27 13   Alk Phosphatase 38 - 126 U/L 53 53 81  Total Bilirubin 0.3 - 1.2 mg/dL 0.7 0.7 0.5    2.  AKI due to dehydration.  Hold diuretics and ARB, gentle hydration and monitor.  Renal function improving.  3.  Hypothyroidism.  Home dose Synthroid.  4.  Essential hypertension.  On beta-blocker at home dose.  5.  GERD.  On PPI.  6.  Morbid obesity.  BMI of 47.  Follow with PCP for weight loss.  7.  DM type II.  Holding oral hypoglycemics, placed on Lantus twice daily 30 units along with sliding scale and Premeal NovoLog.  Lab Results  Component Value Date   HGBA1C 7.0 (H) 12/15/2019   CBG (last 3)  Recent Labs    12/15/19 2220 12/16/19 0139 12/16/19 0737  GLUCAP 200* 274* 319*     Condition -   Guarded  Family Communication  : None ( husband here)   Code Status :  Full  Consults  :  None  Procedures  :  None  PUD Prophylaxis : PPI  Disposition Plan  :    Status is: Inpatient  Remains inpatient appropriate because:IV treatments appropriate due to intensity of illness or inability to take PO   Dispo: The patient is from: Home              Anticipated d/c is to: Home              Anticipated d/c date is: > 3 days              Patient currently is not medically  stable to d/c.   DVT Prophylaxis  :  Lovenox    Lab Results  Component Value Date   PLT PLATELET CLUMPS NOTED ON SMEAR, UNABLE TO ESTIMATE 12/16/2019    Diet :  Diet Order            Diet clear liquid Room service appropriate? Yes; Fluid consistency: Thin  Diet effective now                  Inpatient Medications  Scheduled Meds:  albuterol  2 puff Inhalation Q6H   vitamin C  500 mg Oral Daily   aspirin  81 mg Oral Daily   dexamethasone (DECADRON) injection  6 mg Intravenous Q24H   enoxaparin (LOVENOX) injection  60 mg Subcutaneous Q24H   insulin aspart  0-15 Units Subcutaneous TID WC   insulin aspart  0-5 Units Subcutaneous QHS   insulin aspart  3 Units Subcutaneous TID WC   insulin glargine  30 Units Subcutaneous BID   levothyroxine  225 mcg Oral Q0600   metoprolol succinate  100 mg Oral Daily   pantoprazole  40 mg Oral Daily   sodium chloride flush  10-40 mL Intracatheter Q12H   Vitamin D-3  1,000  Units Oral Daily   zinc sulfate  220 mg Oral Daily   Continuous Infusions:  lactated ringers     remdesivir 100 mg in NS 100 mL     PRN Meds:.acetaminophen, chlorpheniramine-HYDROcodone, guaiFENesin-dextromethorphan, loperamide, [DISCONTINUED] ondansetron **OR** ondansetron (ZOFRAN) IV, sodium chloride flush  Antibiotics  :    Anti-infectives (From admission, onward)   Start     Dose/Rate Route Frequency Ordered Stop   12/16/19 1000  remdesivir 100 mg in sodium chloride 0.9 % 100 mL IVPB     Discontinue    "Followed by" Linked Group Details   100 mg 200 mL/hr over 30 Minutes Intravenous Daily 12/15/19 1747 12/20/19 0959   12/15/19 1800  remdesivir 200 mg in sodium chloride 0.9% 250 mL IVPB       "Followed by" Linked Group Details   200 mg 580 mL/hr over 30 Minutes Intravenous Once 12/15/19 1747 12/15/19 2121       Time Spent in minutes  30   Lala Lund M.D on 12/16/2019 at 11:50 AM  To page go to [email or web address] - password [facility]  [facility] -  Office  [phone]   See all Orders from [name] for further details    Objective:   Vitals:   12/16/19 0417 12/16/19 0620 12/16/19 0650 12/16/19 0735  BP: (!) 129/57   (!) 134/69  Pulse: 65 59 55 56  Resp: 21 17 19 22   Temp: 97.8 F (36.6 C)   (!) 97.5 F (36.4 C)  TempSrc: Oral   Oral  SpO2: 96% 100% 96% 98%  Weight:      Height:        Wt Readings from Last 3 Encounters:  12/16/19 (!) 121.2 kg  06/28/12 109.3 kg     Intake/Output Summary (Last 24 hours) at 12/16/2019 1150 Last data filed at 12/16/2019 0654 Gross per 24 hour  Intake 367.5 ml  Output --  Net 367.5 ml     Physical Exam  Awake Alert, No new F.N deficits, Normal affect [place].AT,PERRAL Supple Neck,No JVD, No cervical lymphadenopathy appriciated.  Symmetrical Chest wall movement, Good air movement bilaterally, CTAB RRR,No Gallops,Rubs or new Murmurs, No Parasternal Heave +ve B.Sounds, Abd Soft, No tenderness, No organomegaly appriciated, No rebound - guarding or rigidity. No Cyanosis, Clubbing or edema, No new Rash or bruise     Data Review:    CBC Recent Labs  Lab 12/15/19 1500 12/16/19 0450  WBC 6.4 5.1  HGB 10.7* 9.6*  HCT 35.3* 31.8*  PLT PLATELET CLUMPS NOTED ON SMEAR, UNABLE TO ESTIMATE PLATELET CLUMPS NOTED ON SMEAR, UNABLE TO ESTIMATE  MCV 89.1 87.6  MCH 27.0 26.4  MCHC 30.3 30.2  RDW 16.3* 16.1*  LYMPHSABS 1.0 0.5*  MONOABS 0.4 0.1  EOSABS 0.0 0.0  BASOSABS 0.0 0.0    Chemistries  Recent Labs  Lab 12/15/19 1500 12/15/19 1806 12/15/19 1813 12/16/19 0450  NA 136  --   --  137  K 4.4  --   --  4.7  CL 100  --   --  104  CO2 24  --   --  21*  GLUCOSE 198*  --   --  341*  BUN 53*  --   --  51*  CREATININE 2.27*  --   --  1.70*  CALCIUM 8.2*  --   --  7.8*  AST 49*  --   --  41  ALT 27  --   --  27  ALKPHOS 53  --   --  53  BILITOT 0.7  --   --  0.7  MG  --   --   --  1.9  TSH  --   --  2.155  --   HGBA1C  --  7.0*  --   --       ------------------------------------------------------------------------------------------------------------------ No results for input(s): CHOL, HDL, LDLCALC, TRIG, CHOLHDL, LDLDIRECT in the last 72 hours.  Lab Results  Component Value Date   HGBA1C 7.0 (H) 12/15/2019   ------------------------------------------------------------------------------------------------------------------ Recent Labs    12/15/19 1813  TSH 2.155    Cardiac Enzymes No results for input(s): CKMB, TROPONINI, MYOGLOBIN in the last 168 hours.  Invalid input(s): CK ------------------------------------------------------------------------------------------------------------------    Component Value Date/Time   BNP 43.8 12/16/2019 0450    Micro Results Recent Results (from the past 240 hour(s))  SARS Coronavirus 2 by RT PCR (hospital order, performed in [facility] hospital lab) Nasopharyngeal Nasopharyngeal Swab     Status: Abnormal   Collection Time: 12/15/19  5:57 PM   Specimen: Nasopharyngeal Swab  Result Value Ref Range Status   SARS Coronavirus 2 POSITIVE (A) NEGATIVE Final    Comment: RESULT CALLED TO, READ BACK BY AND VERIFIED WITH: E,ROBERTSON @1935  12/15/19 EB (NOTE) SARS-CoV-2 target nucleic acids are DETECTED  SARS-CoV-2 RNA is generally detectable in upper respiratory specimens  during the acute phase of infection.  Positive results are indicative  of the presence of the identified virus, but do not rule out bacterial infection or co-infection with other pathogens not detected by the test.  Clinical correlation with patient history and  other diagnostic information is necessary to determine patient infection status.  The expected result is negative.  Fact Sheet for Patients:   StrictlyIdeas.no   Fact Sheet for Healthcare Providers:   BankingDealers.co.za    This test is not yet approved or cleared by the [place] FDA and  has been  authorized for detection and/or diagnosis of SARS-CoV-2 by FDA under an Emergency Use Authorization (EUA).  This EUA will remain in effect (meaning this test can  be used) for the duration of  the COVID-19 declaration under Section 564(b)(1) of the Act, 21 U.S.C. section 360-bbb-3(b)(1), unless the authorization is terminated or revoked sooner.  Performed at [facility], [place] [address] [place], [place] [address]     Radiology Reports DG Chest Portable 1 View  Result Date: 12/15/2019 CLINICAL DATA:  69 year old female with shortness of breath. Positive COVID-19. EXAM: PORTABLE CHEST 1 VIEW COMPARISON:  Chest radiograph dated 09/10/2017. FINDINGS: Bilateral faint streaky densities primarily involving the peripheral and subpleural region of the mid to lower lung field most consistent with pneumonia, likely viral or atypical in etiology. Clinical correlation and follow-up recommended. No lobar consolidation, pleural effusion, pneumothorax. Mild cardiomegaly. No acute osseous pathology. IMPRESSION: 1. Multifocal pneumonia, likely viral or atypical in etiology. Clinical correlation and follow-up recommended. 2. Mild cardiomegaly. Electronically Signed   By: Anner Crete M.D.   On: 12/15/2019 15:14

## 2019-12-16 NOTE — Progress Notes (Signed)
Discussed with Dr. Candiss Norse, okay to resume home dose levothyroxine.  Mercy Riding, PharmD PGY1 Acute Care Pharmacy Resident Please refer to [facility] for unit-specific pharmacist

## 2019-12-16 NOTE — ED Provider Notes (Signed)
[facility] EMERGENCY DEPARTMENT Provider Note   CSN: [id number] Arrival date & time: 12/15/19  1332     History Chief Complaint  Patient presents with  . Shortness of Breath  . Fatigue    Laura [name] is a 69 y.o. female.  HPI   69 year old female with general malaise, cough, shortness of breath, fever and diarrhea.  She is recently diagnosed with Covid last week.  Symptoms have progressed since that time.  Multiple episodes of nonbloody diarrhea.  Nauseated, but no vomiting.  Nonproductive cough.  Feels short of breath and having subjective fevers.  Past Medical History:  Diagnosis Date  . Anxiety   . Diabetes mellitus without complication ([place])   . GERD (gastroesophageal reflux disease)   . Hypertension   . Thyroid disease     Patient Active Problem List   Diagnosis Date Noted  . Pneumonia due to COVID-19 virus 12/15/2019  . Acute hypoxemic respiratory failure ([place]) 12/15/2019  . Acute hypoxemic respiratory failure due to COVID-19 ([place]) 12/15/2019  . AKI (acute kidney injury) ([place])   . Anxiety 03/07/2018  . Graves' disease 03/07/2018  . Normocytic anemia 07/03/2017  . Thrombocytopenia ([place]) 07/03/2017  . Gastroesophageal reflux disease without esophagitis 06/19/2017  . Acquired hypothyroidism 12/17/2014  . Hyperlipidemia associated with type 2 diabetes mellitus ([place]) 12/17/2014  . Essential hypertension 03/26/2014  . Diabetes mellitus without complication ([place]) 76/73/4193  . Abdominal pain 06/28/2012    Past Surgical History:  Procedure Laterality Date  . ABDOMINAL HYSTERECTOMY    . BREAST SURGERY     breast mass  . DILATION AND CURETTAGE OF UTERUS    . FOOT SURGERY       OB History   No obstetric history on file.     Family History  Problem Relation Age of Onset  . Hypertension Sister   . Ovarian cancer Daughter   . Leukemia Brother   . Thyroid disease Daughter   . Liver disease Father   . Prostate cancer Other     Social  History   Tobacco Use  . Smoking status: Never Smoker  Substance Use Topics  . Alcohol use: No  . Drug use: No    Home Medications Prior to Admission medications   Medication Sig Start Date End Date Taking? Authorizing Provider  aspirin 81 MG tablet Take 81 mg by mouth daily.   Yes [provider]  budesonide-formoterol (SYMBICORT) 160-4.5 MCG/ACT inhaler Inhale 1 puff into the lungs 2 (two) times daily as needed (for flares).   Yes [provider]  Cholecalciferol (VITAMIN D-3) 25 MCG (1000 UT) CAPS Take 1,000 Units by mouth daily.   Yes [provider]  dexlansoprazole (DEXILANT) 60 MG capsule Take 60 mg by mouth daily.   Yes [provider]  Dulaglutide (TRULICITY) 7.90 WI/0.9BD SOPN Inject 75 mg into the skin every 7 (seven) days.   Yes [provider]  glucosamine-chondroitin 500-400 MG tablet Take 3 tablets by mouth daily.   Yes [provider]  metoprolol succinate (TOPROL-XL) 100 MG 24 hr tablet Take 100 mg by mouth daily. Take with or immediately following a meal.   Yes [provider]  Pediatric Multivitamins-Iron (FLINTSTONES PLUS IRON PO) Take 1 tablet by mouth daily with breakfast.   Yes [provider]  pioglitazone (ACTOS) 30 MG tablet Take 30 mg by mouth daily.   Yes [provider]  potassium chloride SA (KLOR-CON M20) 20 MEQ tablet Take 20 mEq by mouth daily.  Yes [provider]  SYNTHROID 200 MCG tablet Take 200 mcg by mouth daily before breakfast.   Yes [provider]  SYNTHROID 25 MCG tablet Take 25 mcg by mouth daily before breakfast.   Yes [provider]  telmisartan (MICARDIS) 40 MG tablet Take 40 mg by mouth daily.   Yes [provider]  triamterene-hydrochlorothiazide (DYAZIDE) 37.5-25 MG capsule Take 1 capsule by mouth daily.   Yes [provider]  diazepam (VALIUM) 5 MG tablet Take 5 mg by mouth every 6 (six) hours as needed. Patient  not taking: Reported on 12/15/2019    [provider]  glipiZIDE (GLUCOTROL XL) 10 MG 24 hr tablet Take 10 mg by mouth daily with breakfast.    [provider]    Allergies    Fluconazole, Kombiglyze [saxagliptin-metformin er], Onglyza [saxagliptin], Toviaz [fesoterodine], Victoza [liraglutide], Augmentin [amoxicillin-pot clavulanate], Brilliant blue fcf, Cephalosporins, Cytomel [liothyronine], Iohexol, Ivp dye [iodinated diagnostic agents], Allegra [fexofenadine hcl], Avapro [irbesartan], Cephalexin, Januvia [sitagliptin], Lotrel [amlodipine besy-benazepril hcl], Metformin, Metformin and related, Ozempic (0.25 or 0.5 mg-dose) [semaglutide(0.25 or 0.5mg -dos)], Pioglitazone, Naproxen sodium, Semaglutide, and Sulfa antibiotics  Review of Systems   Review of Systems All systems reviewed and negative, other than as noted in HPI. Physical Exam Updated Vital Signs BP (!) 128/55   Pulse 72   Temp 98.9 F (37.2 C) (Oral)   Resp (!) 31   SpO2 96%   Physical Exam Vitals and nursing note reviewed.  Constitutional:      General: She is not in acute distress.    Appearance: She is well-developed.  HENT:     Head: Normocephalic and atraumatic.  Eyes:     General:        Right eye: No discharge.        Left eye: No discharge.     Conjunctiva/sclera: Conjunctivae normal.  Cardiovascular:     Rate and Rhythm: Normal rate and regular rhythm.     Heart sounds: Normal heart sounds. No murmur heard.  No friction rub. No gallop.   Pulmonary:     Breath sounds: Normal breath sounds.     Comments: Frequent coughing.  Tachypnea.  I do not appreciate any adventitious breath sounds. Abdominal:     General: There is no distension.     Palpations: Abdomen is soft.     Tenderness: There is no abdominal tenderness.  Musculoskeletal:        General: No tenderness.     Cervical back: Neck supple.  Skin:    General: Skin is warm and dry.  Neurological:     Mental Status: She is alert.   Psychiatric:        Behavior: Behavior normal.        Thought Content: Thought content normal.     ED Results / Procedures / Treatments   Labs (all labs ordered are listed, but only abnormal results are displayed) Labs Reviewed  SARS CORONAVIRUS 2 BY RT PCR (HOSPITAL ORDER, [place]) - Abnormal; Notable for the following components:      Result Value   SARS Coronavirus 2 POSITIVE (*)    All other components within normal limits  COMPREHENSIVE METABOLIC PANEL - Abnormal; Notable for the following components:   Glucose, Bld 198 (*)    BUN 53 (*)    Creatinine, Ser 2.27 (*)    Calcium 8.2 (*)    Albumin 3.0 (*)    AST 49 (*)    GFR calc  non Af Amer 22 (*)    GFR calc Af Amer 25 (*)    All other components within normal limits  CBC WITH DIFFERENTIAL/PLATELET - Abnormal; Notable for the following components:   Hemoglobin 10.7 (*)    HCT 35.3 (*)    RDW 16.3 (*)    All other components within normal limits  C-REACTIVE PROTEIN - Abnormal; Notable for the following components:   CRP 15.9 (*)    All other components within normal limits  D-DIMER, QUANTITATIVE (NOT AT [facility]) - Abnormal; Notable for the following components:   D-Dimer, Quant 1.48 (*)    All other components within normal limits  FERRITIN - Abnormal; Notable for the following components:   Ferritin 946 (*)    All other components within normal limits  LACTATE DEHYDROGENASE - Abnormal; Notable for the following components:   LDH 475 (*)    All other components within normal limits  HEMOGLOBIN A1C - Abnormal; Notable for the following components:   Hgb A1c MFr Bld 7.0 (*)    All other components within normal limits  IRON AND TIBC - Abnormal; Notable for the following components:   Iron 17 (*)    TIBC 230 (*)    Saturation Ratios 7 (*)    All other components within normal limits  FERRITIN - Abnormal; Notable for the following components:   Ferritin 1,053 (*)    All other components  within normal limits  CBG MONITORING, ED - Abnormal; Notable for the following components:   Glucose-Capillary 200 (*)    All other components within normal limits  TROPONIN I (HIGH SENSITIVITY) - Abnormal; Notable for the following components:   Troponin I (High Sensitivity) 39 (*)    All other components within normal limits  TROPONIN I (HIGH SENSITIVITY) - Abnormal; Notable for the following components:   Troponin I (High Sensitivity) 47 (*)    All other components within normal limits  CULTURE, BLOOD (ROUTINE X 2)  CULTURE, BLOOD (ROUTINE X 2)  LACTIC ACID, PLASMA  LACTIC ACID, PLASMA  HIV ANTIBODY (ROUTINE TESTING W REFLEX)  BRAIN NATRIURETIC PEPTIDE  FIBRINOGEN  HEPATITIS B SURFACE ANTIGEN  PROCALCITONIN  TSH  CBC WITH DIFFERENTIAL/PLATELET  COMPREHENSIVE METABOLIC PANEL  C-REACTIVE PROTEIN  D-DIMER, QUANTITATIVE (NOT AT [facility])  MAGNESIUM  PHOSPHORUS  ABO/RH    EKG EKG Interpretation  Date/Time:  Monday December 15 2019 19:00:22 EDT Ventricular Rate:  83 PR Interval:    QRS Duration: 96 QT Interval:  376 QTC Calculation: 442 R Axis:   33 Text Interpretation: Sinus rhythm Low voltage, extremity leads Minimal ST elevation, lateral leads Baseline wander in lead(s) V6 No old tracing to compare Confirmed by Delora Fuel ([id number]) on 12/16/2019 12:31:16 AM   Radiology DG Chest Portable 1 View  Result Date: 12/15/2019 CLINICAL DATA:  69 year old female with shortness of breath. Positive COVID-19. EXAM: PORTABLE CHEST 1 VIEW COMPARISON:  Chest radiograph dated 09/10/2017. FINDINGS: Bilateral faint streaky densities primarily involving the peripheral and subpleural region of the mid to lower lung field most consistent with pneumonia, likely viral or atypical in etiology. Clinical correlation and follow-up recommended. No lobar consolidation, pleural effusion, pneumothorax. Mild cardiomegaly. No acute osseous pathology. IMPRESSION: 1. Multifocal pneumonia, likely viral or atypical in  etiology. Clinical correlation and follow-up recommended. 2. Mild cardiomegaly. Electronically Signed   By: Anner Crete M.D.   On: 12/15/2019 15:14    Procedures Procedures (including critical care time)  Medications Ordered in ED Medications  enoxaparin (LOVENOX) injection 40  mg (40 mg Subcutaneous Given 12/15/19 1854)  remdesivir 200 mg in sodium chloride 0.9% 250 mL IVPB (0 mg Intravenous Stopped 12/15/19 2121)    Followed by  remdesivir 100 mg in sodium chloride 0.9 % 100 mL IVPB (has no administration in time range)  albuterol (VENTOLIN HFA) 108 (90 Base) MCG/ACT inhaler 2 puff (2 puffs Inhalation Given 12/15/19 2015)  dexamethasone (DECADRON) injection 6 mg (6 mg Intravenous Given 12/15/19 1855)  guaiFENesin-dextromethorphan (ROBITUSSIN DM) 100-10 MG/5ML syrup 10 mL (10 mLs Oral Given 12/15/19 2229)  chlorpheniramine-HYDROcodone (TUSSIONEX) 10-8 MG/5ML suspension 5 mL (has no administration in time range)  ascorbic acid (VITAMIN C) tablet 500 mg (500 mg Oral Given 12/15/19 1853)  zinc sulfate capsule 220 mg (220 mg Oral Given 12/15/19 1853)  acetaminophen (TYLENOL) tablet 650 mg (has no administration in time range)  ondansetron (ZOFRAN) tablet 4 mg (has no administration in time range)    Or  ondansetron (ZOFRAN) injection 4 mg (has no administration in time range)  insulin aspart (novoLOG) injection 0-15 Units (has no administration in time range)  insulin aspart (novoLOG) injection 0-5 Units (0 Units Subcutaneous Not Given 12/15/19 2222)  loperamide (IMODIUM) capsule 4 mg (4 mg Oral Given 12/15/19 2228)  0.9 %  sodium chloride infusion ( Intravenous New Bag/Given 12/15/19 1853)  sodium chloride flush (NS) 0.9 % injection 3 mL (3 mLs Intravenous Given 12/15/19 1815)  lactated ringers bolus 1,000 mL (0 mLs Intravenous Stopped 12/15/19 2141)    ED Course  I have reviewed the triage vital signs and the nursing notes.  Pertinent labs & imaging results that were available during my care  of the patient were reviewed by me and considered in my medical decision making (see chart for details).    MDM Rules/Calculators/A&P                          69 year old female with progressive symptoms with known Covid.  O2 sats in high 80s on room air.  No baseline oxygen requirement.  Admission.  TAEYA THEALL was evaluated in Emergency Department on 12/16/2019 for the symptoms described in the history of present illness. She was evaluated in the context of the global COVID-19 pandemic, which necessitated consideration that the patient might be at risk for infection with the SARS-CoV-2 virus that causes COVID-19. Institutional protocols and algorithms that pertain to the evaluation of patients at risk for COVID-19 are in a state of rapid change based on information released by regulatory bodies including the CDC and federal and state organizations. These policies and algorithms were followed during the patient's care in the ED.  Final Clinical Impression(s) / ED Diagnoses Final diagnoses:  COVID-19 virus infection    Rx / DC Orders ED Discharge Orders    None       Virgel Manifold, MD 12/16/19 [phone]

## 2019-12-17 DIAGNOSIS — I10 Essential (primary) hypertension: Secondary | ICD-10-CM

## 2019-12-17 LAB — COMPREHENSIVE METABOLIC PANEL
ALT: 26 U/L (ref 0–44)
AST: 32 U/L (ref 15–41)
Albumin: 2.5 g/dL — ABNORMAL LOW (ref 3.5–5.0)
Alkaline Phosphatase: 52 U/L (ref 38–126)
Anion gap: 10 (ref 5–15)
BUN: 47 mg/dL — ABNORMAL HIGH (ref 8–23)
CO2: 24 mmol/L (ref 22–32)
Calcium: 8.4 mg/dL — ABNORMAL LOW (ref 8.9–10.3)
Chloride: 106 mmol/L (ref 98–111)
Creatinine, Ser: 1.32 mg/dL — ABNORMAL HIGH (ref 0.44–1.00)
GFR calc Af Amer: 48 mL/min — ABNORMAL LOW (ref >60)
GFR calc non Af Amer: 42 mL/min — ABNORMAL LOW (ref >60)
Glucose, Bld: 252 mg/dL — ABNORMAL HIGH (ref 70–99)
Potassium: 4.7 mmol/L (ref 3.5–5.1)
Sodium: 140 mmol/L (ref 135–145)
Total Bilirubin: 0.6 mg/dL (ref 0.3–1.2)
Total Protein: 6.2 g/dL — ABNORMAL LOW (ref 6.5–8.1)

## 2019-12-17 LAB — CBC WITH DIFFERENTIAL/PLATELET
Abs Immature Granulocytes: 0.05 10*3/uL (ref 0.00–0.07)
Basophils Absolute: 0 10*3/uL (ref 0.0–0.1)
Basophils Relative: 0 %
Eosinophils Absolute: 0 10*3/uL (ref 0.0–0.5)
Eosinophils Relative: 0 %
HCT: 33.2 % — ABNORMAL LOW (ref 36.0–46.0)
Hemoglobin: 10.2 g/dL — ABNORMAL LOW (ref 12.0–15.0)
Immature Granulocytes: 1 %
Lymphocytes Relative: 20 %
Lymphs Abs: 1.1 10*3/uL (ref 0.7–4.0)
MCH: 26.9 pg (ref 26.0–34.0)
MCHC: 30.7 g/dL (ref 30.0–36.0)
MCV: 87.6 fL (ref 80.0–100.0)
Monocytes Absolute: 0.4 10*3/uL (ref 0.1–1.0)
Monocytes Relative: 7 %
Neutro Abs: 3.8 10*3/uL (ref 1.7–7.7)
Neutrophils Relative %: 72 %
Platelets: 228 10*3/uL (ref 150–400)
RBC: 3.79 MIL/uL — ABNORMAL LOW (ref 3.87–5.11)
RDW: 15.9 % — ABNORMAL HIGH (ref 11.5–15.5)
WBC: 5.3 10*3/uL (ref 4.0–10.5)
nRBC: 0 % (ref 0.0–0.2)

## 2019-12-17 LAB — GLUCOSE, CAPILLARY
Glucose-Capillary: 273 mg/dL — ABNORMAL HIGH (ref 70–99)
Glucose-Capillary: 316 mg/dL — ABNORMAL HIGH (ref 70–99)
Glucose-Capillary: 265 mg/dL — ABNORMAL HIGH (ref 70–99)
Glucose-Capillary: 209 mg/dL — ABNORMAL HIGH (ref 70–99)

## 2019-12-17 LAB — MAGNESIUM: Magnesium: 2.1 mg/dL (ref 1.7–2.4)

## 2019-12-17 LAB — D-DIMER, QUANTITATIVE: D-Dimer, Quant: 0.83 ug/mL-FEU — ABNORMAL HIGH (ref 0.00–0.50)

## 2019-12-17 LAB — BRAIN NATRIURETIC PEPTIDE: B Natriuretic Peptide: 171.4 pg/mL — ABNORMAL HIGH (ref 0.0–100.0)

## 2019-12-17 LAB — C-REACTIVE PROTEIN: CRP: 10.6 mg/dL — ABNORMAL HIGH (ref <1.0)

## 2019-12-17 NOTE — Progress Notes (Signed)
Occupational Therapy Evaluation Patient Details Name: Laura [name] MRN: [id number] DOB: 1951/04/18 Today's Date: 12/17/2019    History of Present Illness 69 y.o. female admitted on 12/15/19 for SOB, diarrhea, and fatigue.  Pt dx with COVID 19 a week PTA.  Pt here with acute hypoxic respiratory failure, AKI d/t dehydration.  Pt with significant PMH of DM2, morbid obesity, hypothyroidism, HTN, anxiety, and foot surgery.   Clinical Impression   Patient lives at home with husband.  She is independent at baseline with use of SPC for mobility.  Patient presenting today with decreased activity tolerance, strength , and balance.  Patient on 2L O2 with SpO2 98 at rest, though decreased to 85 with mobility.  Educated on and practiced breathing exercises and pursed lip breathing.  Able to complete UB ADLs with set up and LB with min assist.  Transferred to [facility] and chair with min guard and cueing for safe hand placement.  Will continue to follow with OT acutely to address the deficits listed below.      Follow Up Recommendations  Home health OT;Supervision - Intermittent    Equipment Recommendations  3 in 1 bedside commode    Recommendations for Other Services       Precautions / Restrictions Precautions Precautions: Fall Precaution Comments: watch sats Restrictions Weight Bearing Restrictions: No      Mobility Bed Mobility Overal bed mobility: Needs Assistance Bed Mobility: Supine to Sit     Supine to sit: Min assist     General bed mobility comments: One hand assist to help with bringing trunk upright  Transfers Overall transfer level: Needs assistance Equipment used: 1 person hand held assist Transfers: Stand Pivot Transfers;Sit to/from Stand Sit to Stand: Min guard;From elevated surface Stand pivot transfers: Min guard       General transfer comment: Some cueing for safety    Balance Overall balance assessment: Needs assistance Sitting-balance support: No upper extremity  supported;Feet supported Sitting balance-Leahy Scale: Fair     Standing balance support: Single extremity supported;During functional activity Standing balance-Leahy Scale: Fair Standing balance comment: One hand assist for dynamic balance                           ADL either performed or assessed with clinical judgement   ADL Overall ADL's : Needs assistance/impaired Eating/Feeding: Independent;Sitting   Grooming: Set up;Sitting   Upper Body Bathing: Set up;Sitting   Lower Body Bathing: Minimal assistance;Sit to/from stand   Upper Body Dressing : Set up;Sitting   Lower Body Dressing: Minimal assistance;Sit to/from stand Lower Body Dressing Details (indicate cue type and reason): Max for donning socks Toilet Transfer: Min Associate Professor and Hygiene: Min guard;Sit to/from stand       Functional mobility during ADLs: Min guard       Vision         Perception     Praxis      Pertinent Vitals/Pain Pain Assessment: No/denies pain     Hand Dominance Right   Extremity/Trunk Assessment Upper Extremity Assessment Upper Extremity Assessment: Generalized weakness   Lower Extremity Assessment Lower Extremity Assessment: Defer to PT evaluation;Generalized weakness       Communication Communication Communication: No difficulties   Cognition Arousal/Alertness: Awake/alert Behavior During Therapy: WFL for tasks assessed/performed Overall Cognitive Status: Within Functional Limits for tasks assessed  General Comments       Exercises     Shoulder Instructions      Home Living Family/patient expects to be discharged to:: Private residence Living Arrangements: Spouse/significant other Available Help at Discharge: Family;Other (Comment) (Daughter near, Husband works M-F 6-2:30) Type of Home: Mobile home Home Access: Stairs to enter Technical brewer of  Steps: 5 Entrance Stairs-Rails: Right Home Layout: One level     Bathroom Shower/Tub: Tub/shower unit         Home Equipment: Marine scientist - single point          Prior Functioning/Environment Level of Independence: Independent with assistive device(s)        Comments: Husband supervises getting in/out of shower.  Uses SPC for mobility        OT Problem List: Decreased strength;Decreased activity tolerance;Impaired balance (sitting and/or standing);Cardiopulmonary status limiting activity      OT Treatment/Interventions: Self-care/ADL training;Therapeutic exercise;Energy conservation;Therapeutic activities;Balance training;Patient/family education    OT Goals(Current goals can be found in the care plan section) Acute Rehab OT Goals Patient Stated Goal: to get stronger OT Goal Formulation: With patient Time For Goal Achievement: 12/31/19 Potential to Achieve Goals: Good  OT Frequency: Min 2X/week   Barriers to D/C: Decreased caregiver support  husband and daughter usually not available during the day       Co-evaluation              AM-PAC OT "6 Clicks" Daily Activity     Outcome Measure Help from another person eating meals?: None Help from another person taking care of personal grooming?: A Little Help from another person toileting, which includes using toliet, bedpan, or urinal?: A Little Help from another person bathing (including washing, rinsing, drying)?: A Little Help from another person to put on and taking off regular upper body clothing?: A Little Help from another person to put on and taking off regular lower body clothing?: A Little 6 Click Score: 19   End of Session Equipment Utilized During Treatment: Oxygen Nurse Communication: Mobility status  Activity Tolerance: Patient tolerated treatment well Patient left: in chair;with call bell/phone within reach;with chair alarm set  OT Visit Diagnosis: Unsteadiness on feet (R26.81);Muscle  weakness (generalized) (M62.81);Other (comment) (decreased cardiopulminary status)                Time: 4536-4680 OT Time Calculation (min): 26 min Charges:  OT General Charges $OT Visit: 1 Visit OT Evaluation $OT Eval Moderate Complexity: 1 Mod OT Treatments $Self Care/Home Management : 8-22 mins  August Luz, OTR/L   Phylliss Bob 12/17/2019, 10:31 AM

## 2019-12-17 NOTE — Progress Notes (Signed)
PROGRESS NOTE                                                                                                                                                                                                             Patient Demographics:    Laura [name], is a 69 y.o. female, DOB - 01-31-1951, EYC:[id number]  Outpatient Primary MD for the patient is Chesley Noon, MD    LOS - 2  Admit date - 12/15/2019    Chief Complaint  Patient presents with  . Shortness of Breath  . Fatigue       Brief Narrative - Laura [name] is a 69 y.o. female with medical history significant of hypertension, GERD, hypothyroidism, diabetes mellitus, obesity presents to emergency department with worsening cough, shortness of breath and diarrhea since 1 week, he was diagnosed with COVID-19 infection about a week before ER visit.  In the ER she was diagnosed with COVID-19 pneumonia and admitted to the hospital.   Subjective:    Leonor Liv today has, No headache, No chest pain, No abdominal pain - No Nausea, No new weakness tingling or numbness, improved diarrhea and shortness of breath.   Assessment  & Plan :     Acute Hypoxic Resp. Failure due to Acute Covid 19 Viral Pneumonitis during the ongoing 2020 Covid 19 Pandemic  - she has moderate disease, has been started on IV steroids and Remdesivir will be monitored.  She has consented to Actemra use if needed, if clinically she gets worse Actemra will be used.  But so far her oxygen requirement remains on 2 L, so no indication for Actemra yet. - Encouraged the patient to sit up in chair in the daytime use I-S and flutter valve for pulmonary toiletry and then prone in bed when at night.  Will advance activity and titrate down oxygen as possible.  Actemra off label use - patient was told that if COVID-19 pneumonitis gets worse we might potentially use Actemra off label, patient denies any known history  of active diverticulitis, tuberculosis or hepatitis, understands the risks and benefits and wants to proceed with Actemra treatment if required.    SpO2: 99 % O2 Flow Rate (L/min): 2 L/min  Recent Labs  Lab 12/15/19 1500 12/15/19 1757 12/15/19 1822 12/16/19 0450 12/17/19 0342  WBC 6.4  --   --  5.1 5.3  PLT PLATELET CLUMPS NOTED ON SMEAR, UNABLE TO ESTIMATE  --   --  PLATELET CLUMPS NOTED ON SMEAR, UNABLE TO ESTIMATE 228  CRP  --  15.9*  --  14.8* 10.6*  DDIMER  --  1.48*  --  1.29* 0.83*  PROCALCITON  --  <0.10  --   --   --   LATICACIDVEN 0.9  --  1.6  --   --   SARSCOV2NAA  --  POSITIVE*  --   --   --     Hepatic Function Latest Ref Rng & Units 12/17/2019 12/16/2019 12/15/2019  Total Protein 6.5 - 8.1 g/dL 6.2(L) 6.2(L) 7.2  Albumin 3.5 - 5.0 g/dL 2.5(L) 2.6(L) 3.0(L)  AST 15 - 41 U/L 32 41 49(H)  ALT 0 - 44 U/L 26 27 27   Alk Phosphatase 38 - 126 U/L 52 53 53  Total Bilirubin 0.3 - 1.2 mg/dL 0.6 0.7 0.7    AKI due to dehydration.  Hold diuretics and ARB, gentle hydration and monitor.  Renal function improving.  1.3 today, it was elevated 2.27 on admission  Hypothyroidism.  Home dose Synthroid.  Essential hypertension.  On beta-blocker at home dose.  GERD.  On PPI.  Morbid obesity.  BMI of 47.  Follow with PCP for weight loss.  DM type II.  Holding oral hypoglycemics, placed on Lantus twice daily 30 units along with sliding scale and Premeal NovoLog.  Lab Results  Component Value Date   HGBA1C 7.0 (H) 12/15/2019   CBG (last 3)  Recent Labs    12/16/19 1618 12/16/19 2122 12/17/19 0754  GLUCAP 308* 251* 273*     Condition -   Guarded  Family Communication  : Will update family today.  Code Status :  Full  Consults  :  None  Procedures  :  None  PUD Prophylaxis : PPI  Disposition Plan  :    Status is: Inpatient  Remains inpatient appropriate because:IV treatments appropriate due to intensity of illness or inability to take PO   Dispo: The  patient is from: Home              Anticipated d/c is to: Home              Anticipated d/c date is: 2 days              Patient currently is not medically stable to d/c.   DVT Prophylaxis  :  Lovenox    Lab Results  Component Value Date   PLT 228 12/17/2019    Diet :  Diet Order            Diet clear liquid Room service appropriate? Yes; Fluid consistency: Thin  Diet effective now                  Inpatient Medications  Scheduled Meds: . albuterol  2 puff Inhalation Q6H  . vitamin C  500 mg Oral Daily  . aspirin  81 mg Oral Daily  . cholecalciferol  1,000 Units Oral Daily  . dexamethasone (DECADRON) injection  6 mg Intravenous Q24H  . enoxaparin (LOVENOX) injection  60 mg Subcutaneous Q24H  . insulin aspart  0-15 Units Subcutaneous TID WC  . insulin aspart  0-5 Units Subcutaneous QHS  . insulin aspart  3 Units Subcutaneous TID WC  . insulin glargine  30 Units Subcutaneous BID  . levothyroxine  225 mcg Oral Q0600  . metoprolol succinate  100 mg  Oral Daily  . pantoprazole  40 mg Oral Daily  . sodium chloride flush  10-40 mL Intracatheter Q12H  . zinc sulfate  220 mg Oral Daily   Continuous Infusions: . remdesivir 100 mg in NS 100 mL 100 mg (12/17/19 0951)   PRN Meds:.acetaminophen, chlorpheniramine-HYDROcodone, guaiFENesin-dextromethorphan, loperamide, [DISCONTINUED] ondansetron **OR** ondansetron (ZOFRAN) IV, sodium chloride flush  Antibiotics  :    Anti-infectives (From admission, onward)   Start     Dose/Rate Route Frequency Ordered Stop   12/16/19 1000  remdesivir 100 mg in sodium chloride 0.9 % 100 mL IVPB     Discontinue    "Followed by" Linked Group Details   100 mg 200 mL/hr over 30 Minutes Intravenous Daily 12/15/19 1747 12/20/19 0959   12/15/19 1800  remdesivir 200 mg in sodium chloride 0.9% 250 mL IVPB       "Followed by" Linked Group Details   200 mg 580 mL/hr over 30 Minutes Intravenous Once 12/15/19 1747 12/15/19 2121       Emeline Gins Vernee Baines  M.D on 12/17/2019 at 10:34 AM  To page go to [email or web address] .   [facility] -  Office  [phone]   See all Orders from today for further details    Objective:   Vitals:   12/16/19 2026 12/16/19 2027 12/17/19 0443 12/17/19 0807  BP:   (!) 160/69   Pulse: 60 59 62   Resp: 19 20 18    Temp:   98.1 F (36.7 C)   TempSrc:   Oral   SpO2: 98% 93% 92% 99%  Weight:      Height:        Wt Readings from Last 3 Encounters:  12/16/19 (!) 121.2 kg  06/28/12 109.3 kg     Intake/Output Summary (Last 24 hours) at 12/17/2019 1034 Last data filed at 12/16/2019 1500 Gross per 24 hour  Intake 261.25 ml  Output --  Net 261.25 ml     Physical Exam  Awake Alert, Oriented X 3, No new F.N deficits, Normal affect Symmetrical Chest wall movement, Good air movement bilaterally, CTAB RRR,No Gallops,Rubs or new Murmurs, No Parasternal Heave +ve B.Sounds, Abd Soft, No tenderness, patient does have small bruise, with minimal oozing at subcu Lovenox injection site, no rebound - guarding or rigidity. No Cyanosis, Clubbing or edema, No new Rash or bruise      Data Review:    CBC Recent Labs  Lab 12/15/19 1500 12/16/19 0450 12/17/19 0342  WBC 6.4 5.1 5.3  HGB 10.7* 9.6* 10.2*  HCT 35.3* 31.8* 33.2*  PLT PLATELET CLUMPS NOTED ON SMEAR, UNABLE TO ESTIMATE PLATELET CLUMPS NOTED ON SMEAR, UNABLE TO ESTIMATE 228  MCV 89.1 87.6 87.6  MCH 27.0 26.4 26.9  MCHC 30.3 30.2 30.7  RDW 16.3* 16.1* 15.9*  LYMPHSABS 1.0 0.5* 1.1  MONOABS 0.4 0.1 0.4  EOSABS 0.0 0.0 0.0  BASOSABS 0.0 0.0 0.0    Chemistries  Recent Labs  Lab 12/15/19 1500 12/15/19 1806 12/15/19 1813 12/16/19 0450 12/17/19 0342  NA 136  --   --  137 140  K 4.4  --   --  4.7 4.7  CL 100  --   --  104 106  CO2 24  --   --  21* 24  GLUCOSE 198*  --   --  341* 252*  BUN 53*  --   --  51* 47*  CREATININE 2.27*  --   --  1.70* 1.32*  CALCIUM 8.2*  --   --  7.8* 8.4*  AST 49*  --   --  41 32  ALT 27  --   --  27 26   ALKPHOS 53  --   --  53 52  BILITOT 0.7  --   --  0.7 0.6  MG  --   --   --  1.9 2.1  TSH  --   --  2.155  --   --   HGBA1C  --  7.0*  --   --   --      ------------------------------------------------------------------------------------------------------------------ No results for input(s): CHOL, HDL, LDLCALC, TRIG, CHOLHDL, LDLDIRECT in the last 72 hours.  Lab Results  Component Value Date   HGBA1C 7.0 (H) 12/15/2019   ------------------------------------------------------------------------------------------------------------------ Recent Labs    12/15/19 1813  TSH 2.155    Cardiac Enzymes No results for input(s): CKMB, TROPONINI, MYOGLOBIN in the last 168 hours.  Invalid input(s): CK ------------------------------------------------------------------------------------------------------------------    Component Value Date/Time   BNP 171.4 (H) 12/17/2019 0342    Micro Results Recent Results (from the past 240 hour(s))  SARS Coronavirus 2 by RT PCR (hospital order, performed in [facility] hospital lab) Nasopharyngeal Nasopharyngeal Swab     Status: Abnormal   Collection Time: 12/15/19  5:57 PM   Specimen: Nasopharyngeal Swab  Result Value Ref Range Status   SARS Coronavirus 2 POSITIVE (A) NEGATIVE Final    Comment: RESULT CALLED TO, READ BACK BY AND VERIFIED WITH: E,ROBERTSON '@1933'$  12/15/19 EB (NOTE) SARS-CoV-2 target nucleic acids are DETECTED  SARS-CoV-2 RNA is generally detectable in upper respiratory specimens  during the acute phase of infection.  Positive results are indicative  of the presence of the identified virus, but do not rule out bacterial infection or co-infection with other pathogens not detected by the test.  Clinical correlation with patient history and  other diagnostic information is necessary to determine patient infection status.  The expected result is negative.  Fact Sheet for Patients:   StrictlyIdeas.no   Fact  Sheet for Healthcare Providers:   BankingDealers.co.za    This test is not yet approved or cleared by the [place] FDA and  has been authorized for detection and/or diagnosis of SARS-CoV-2 by FDA under an Emergency Use Authorization (EUA).  This EUA will remain in effect (meaning this test can  be used) for the duration of  the COVID-19 declaration under Section 564(b)(1) of the Act, 21 U.S.C. section 360-bbb-3(b)(1), unless the authorization is terminated or revoked sooner.  Performed at [facility], [place] [address], [place], [place] [address]   Culture, blood (Routine X 2) w Reflex to ID Panel     Status: None (Preliminary result)   Collection Time: 12/15/19  5:58 PM   Specimen: BLOOD RIGHT FOREARM  Result Value Ref Range Status   Specimen Description BLOOD RIGHT FOREARM  Final   Special Requests   Final    BOTTLES DRAWN AEROBIC AND ANAEROBIC Blood Culture results may not be optimal due to an inadequate volume of blood received in culture bottles   Culture   Final    NO GROWTH < 24 HOURS Performed at [facility], [place] [address], [place], [place] [address]    Report Status PENDING  Incomplete  Culture, blood (Routine X 2) w Reflex to ID Panel     Status: None (Preliminary result)   Collection Time: 12/15/19  6:01 PM   Specimen: BLOOD  Result Value Ref Range Status   Specimen Description BLOOD SITE NOT SPECIFIED  Final   Special Requests   Final    BOTTLES DRAWN AEROBIC AND ANAEROBIC Blood Culture results may not be optimal due to an inadequate volume of blood received in culture bottles   Culture   Final    NO GROWTH < 24 HOURS Performed at [facility] [address], [place], [place] [address]    Report Status PENDING  Incomplete    Radiology Reports DG Chest Portable 1 View  Result Date: 12/15/2019 CLINICAL DATA:  69 year old female with shortness of breath. Positive COVID-19. EXAM: PORTABLE CHEST 1 VIEW COMPARISON:  Chest  radiograph dated 09/10/2017. FINDINGS: Bilateral faint streaky densities primarily involving the peripheral and subpleural region of the mid to lower lung field most consistent with pneumonia, likely viral or atypical in etiology. Clinical correlation and follow-up recommended. No lobar consolidation, pleural effusion, pneumothorax. Mild cardiomegaly. No acute osseous pathology. IMPRESSION: 1. Multifocal pneumonia, likely viral or atypical in etiology. Clinical correlation and follow-up recommended. 2. Mild cardiomegaly. Electronically Signed   By: Anner Crete M.D.   On: 12/15/2019 15:14

## 2019-12-17 NOTE — Progress Notes (Signed)
Inpatient Diabetes Program Recommendations  AACE/ADA: New Consensus Statement on Inpatient Glycemic Control (2015)  Target Ranges:  Prepandial:   less than 140 mg/dL      Peak postprandial:   less than 180 mg/dL (1-2 hours)      Critically ill patients:  140 - 180 mg/dL   Lab Results  Component Value Date   GLUCAP 273 (H) 12/17/2019   HGBA1C 7.0 (H) 12/15/2019    Review of Glycemic Control Results for IVEY, NEMBHARD (MRN [id number]) as of 12/17/2019 08:44  Ref. Range 12/16/2019 11:50 12/16/2019 16:18 12/16/2019 21:22 12/17/2019 07:54  Glucose-Capillary Latest Ref Range: 70 - 99 mg/dL 329 (H) 308 (H) 251 (H) 273 (H)   Diabetes history: Type 2 DM Outpatient Diabetes medications: Trulicity 3.29 mg Qwk, Actos 30 mg QD, Glipizide 10 mg QAM Current orders for Inpatient glycemic control: Lantus 30 units BID, Novolog 0-15 units TID, Novolog 0-5 units QHS, Novolog 3 units TID Decadron 6 mg QD  Inpatient Diabetes Program Recommendations:    Consider increasing Lantus 35 units BID.  Noted consult. Assuming glucose trends elevated due to steroid administration. Would not anticipate insulin at discharge, however will plan to reach out to patient today.   Spoke with patient regarding outpatient diabetes management. Verified home medications. Reviewed patient's current A1c of 7% which is within ADA goals. Explained what a A1c is and what it measures. Also reviewed goal A1c with patient, importance of good glucose control @ home, and blood sugar goals. Reviewed patho of DM, role of pancreas, impact of covid and steroids to glucose trends, vascular changes and commorbidities.  Patient has a meter and supplies and checks 2 times per day. Plans to follow up with PCP in August. Has no further questions at this time.   Thanks, Bronson Curb, MSN, RNC-OB Diabetes Coordinator [phone] (8a-5p)

## 2019-12-18 LAB — CBC WITH DIFFERENTIAL/PLATELET
Abs Immature Granulocytes: 0.04 10*3/uL (ref 0.00–0.07)
Basophils Absolute: 0 10*3/uL (ref 0.0–0.1)
Basophils Relative: 0 %
Eosinophils Absolute: 0 10*3/uL (ref 0.0–0.5)
Eosinophils Relative: 0 %
HCT: 34 % — ABNORMAL LOW (ref 36.0–46.0)
Hemoglobin: 10.1 g/dL — ABNORMAL LOW (ref 12.0–15.0)
Immature Granulocytes: 1 %
Lymphocytes Relative: 15 %
Lymphs Abs: 0.8 10*3/uL (ref 0.7–4.0)
MCH: 26.4 pg (ref 26.0–34.0)
MCHC: 29.7 g/dL — ABNORMAL LOW (ref 30.0–36.0)
MCV: 88.8 fL (ref 80.0–100.0)
Monocytes Absolute: 0.3 10*3/uL (ref 0.1–1.0)
Monocytes Relative: 6 %
Neutro Abs: 4.2 10*3/uL (ref 1.7–7.7)
Neutrophils Relative %: 78 %
Platelets: 284 10*3/uL (ref 150–400)
RBC: 3.83 MIL/uL — ABNORMAL LOW (ref 3.87–5.11)
RDW: 16.2 % — ABNORMAL HIGH (ref 11.5–15.5)
WBC: 5.4 10*3/uL (ref 4.0–10.5)
nRBC: 0 % (ref 0.0–0.2)

## 2019-12-18 LAB — COMPREHENSIVE METABOLIC PANEL
ALT: 24 U/L (ref 0–44)
AST: 23 U/L (ref 15–41)
Albumin: 2.6 g/dL — ABNORMAL LOW (ref 3.5–5.0)
Alkaline Phosphatase: 50 U/L (ref 38–126)
Anion gap: 9 (ref 5–15)
BUN: 43 mg/dL — ABNORMAL HIGH (ref 8–23)
CO2: 26 mmol/L (ref 22–32)
Calcium: 8.6 mg/dL — ABNORMAL LOW (ref 8.9–10.3)
Chloride: 107 mmol/L (ref 98–111)
Creatinine, Ser: 1.33 mg/dL — ABNORMAL HIGH (ref 0.44–1.00)
GFR calc Af Amer: 48 mL/min — ABNORMAL LOW (ref >60)
GFR calc non Af Amer: 41 mL/min — ABNORMAL LOW (ref >60)
Glucose, Bld: 254 mg/dL — ABNORMAL HIGH (ref 70–99)
Potassium: 4.7 mmol/L (ref 3.5–5.1)
Sodium: 142 mmol/L (ref 135–145)
Total Bilirubin: 0.8 mg/dL (ref 0.3–1.2)
Total Protein: 6.1 g/dL — ABNORMAL LOW (ref 6.5–8.1)

## 2019-12-18 LAB — C-REACTIVE PROTEIN: CRP: 5.9 mg/dL — ABNORMAL HIGH (ref <1.0)

## 2019-12-18 LAB — GLUCOSE, CAPILLARY
Glucose-Capillary: 263 mg/dL — ABNORMAL HIGH (ref 70–99)
Glucose-Capillary: 323 mg/dL — ABNORMAL HIGH (ref 70–99)
Glucose-Capillary: 276 mg/dL — ABNORMAL HIGH (ref 70–99)
Glucose-Capillary: 209 mg/dL — ABNORMAL HIGH (ref 70–99)

## 2019-12-18 LAB — BRAIN NATRIURETIC PEPTIDE: B Natriuretic Peptide: 178.1 pg/mL — ABNORMAL HIGH (ref 0.0–100.0)

## 2019-12-18 LAB — D-DIMER, QUANTITATIVE: D-Dimer, Quant: 0.65 ug/mL-FEU — ABNORMAL HIGH (ref 0.00–0.50)

## 2019-12-18 LAB — MAGNESIUM: Magnesium: 2.1 mg/dL (ref 1.7–2.4)

## 2019-12-18 MED ORDER — NYSTATIN 100000 UNIT/GM EX POWD
Freq: Three times a day (TID) | CUTANEOUS | Status: DC
  Filled 2019-12-18: qty 15

## 2019-12-18 MED ORDER — INSULIN GLARGINE 100 UNIT/ML ~~LOC~~ SOLN
36.0000 [IU] | Freq: Two times a day (BID) | SUBCUTANEOUS | Status: DC
  Administered 2019-12-18 – 2019-12-19 (×3): 36 [IU] via SUBCUTANEOUS
  Filled 2019-12-18 (×4): qty 0.36

## 2019-12-18 NOTE — Progress Notes (Signed)
ANTICOAGULATION CONSULT NOTE - Follow-Up  Pharmacy Consult for enoxaparin Indication: VTE prophylaxis  Allergies  Allergen Reactions  . Fluconazole Palpitations  . Kombiglyze [Saxagliptin-Metformin Er] Shortness Of Breath, Palpitations and Other (See Comments)    "My heart stops beating"  . Onglyza [Saxagliptin] Shortness Of Breath and Other (See Comments)    "MY heart stops beating"  . Toviaz [Fesoterodine] Shortness Of Breath  . Victoza [Liraglutide] Shortness Of Breath  . Augmentin [Amoxicillin-Pot Clavulanate] Diarrhea and Nausea And Vomiting  . Brilliant Blue Fcf Hives  . Cephalosporins Nausea And Vomiting  . Cytomel [Liothyronine] Hives  . Iohexol Hives and Other (See Comments)    HIVES S/P CONTRAST INJECTION//A.C., Onset Date: [id number]  . Ivp Dye [Iodinated Diagnostic Agents] Hives and Other (See Comments)    And bad sweating  . Allegra [Fexofenadine Hcl] Other (See Comments)    "Bad stomach cramps"  . Avapro [Irbesartan] Other (See Comments)    "Stayed really sick"  . Cephalexin Other (See Comments)    "Makes me really sick"  . Januvia [Sitagliptin] Other (See Comments)    Ineffective   . Lotrel [Amlodipine Besy-Benazepril Hcl] Other (See Comments)    "Could not hold up my head"  . Metformin Other (See Comments)    Reaction not noted by patient   . Metformin And Related Diarrhea  . Ozempic (0.25 Or 0.5 Mg-Dose) [Semaglutide(0.25 Or 0.5mg -Dos)] Diarrhea and Other (See Comments)    Severe diarrhea  . Pioglitazone Other (See Comments)    Reaction not noted by patient- patient taking in 2021  . Naproxen Sodium Palpitations  . Semaglutide Diarrhea  . Sulfa Antibiotics Palpitations    Patient Measurements: Height: 5\' 3"  (160 cm) Weight: (!) 121.2 kg (267 lb 3.2 oz) IBW/kg (Calculated) : 52.4  Vital Signs: Temp: 97.8 F (36.6 C) (07/29 0456) Temp Source: Oral (07/29 0456) BP: 140/55 (07/29 0456) Pulse Rate: 55 (07/29 0456)  Labs: Recent Labs     12/15/19 1500 12/15/19 1757 12/15/19 2146 12/16/19 0450 12/16/19 0450 12/17/19 0342 12/18/19 0417  HGB   < >  --   --  9.6*   < > 10.2* 10.1*  HCT   < >  --   --  31.8*  --  33.2* 34.0*  PLT   < >  --   --  PLATELET CLUMPS NOTED ON SMEAR, UNABLE TO ESTIMATE  --  228 284  CREATININE   < >  --   --  1.70*  --  1.32* 1.33*  TROPONINIHS  --  39* 47*  --   --   --   --    < > = values in this interval not displayed.    Estimated Creatinine Clearance: 51.8 mL/min (A) (by C-G formula based on SCr of 1.33 mg/dL (H)).   Medical History: Past Medical History:  Diagnosis Date  . Anxiety   . Diabetes mellitus without complication ([place])   . GERD (gastroesophageal reflux disease)   . Hypertension   . Thyroid disease     Medications:  Infusions:  . remdesivir 100 mg in NS 100 mL 100 mg (12/18/19 0354)    Assessment: 69 yo female presents COVID+ with required oxygen upon admission. The patient weights 121.2 kg with an IBW of 52.4 kg and BMI of 47.3. Patient presented with AKI with a peak Scr of 2.27, SCr is currently 1.33 and the patient's baseline is 0.8. The patient's CrCl is >30 mL/min, Hgb 10.1, Hct 34 and platelets 284. Pharmacy is consulted to  dose enoxaparin for VTE prophylaxis  CBC is WNL and no signs or symptoms of bleeding are documented. The patient has been receiving 0.5 mg/kg (60 mg) subcutaneous enoxaparin daily since 7.27, dose is still appropriate and will continue therapy.  Goal of Therapy:  Monitor platelets by anticoagulation protocol: Yes   Plan:  Continue enoxaparin 60 mg SubQ daily  Pharmacy will sign off and continue to monitor patient peripherally.  Shauna Hugh, PharmD, [place]  PGY-1 Pharmacy Resident 12/18/2019 9:35 AM  Please check [email or web address] for unit-specific pharmacy phone numbers.

## 2019-12-18 NOTE — Progress Notes (Signed)
PROGRESS NOTE                                                                                                                                                                                                             Patient Demographics:    Laura [name], is a 69 y.o. female, DOB - Feb 25, 1951, KTG:[id number]  Outpatient Primary MD for the patient is Chesley Noon, MD    LOS - 3  Admit date - 12/15/2019    Chief Complaint  Patient presents with  . Shortness of Breath  . Fatigue       Brief Narrative - Laura [name] is a 69 y.o. female with medical history significant of hypertension, GERD, hypothyroidism, diabetes mellitus, obesity presents to emergency department with worsening cough, shortness of breath and diarrhea since 1 week, he was diagnosed with COVID-19 infection about a week before ER visit.  In the ER she was diagnosed with COVID-19 pneumonia and admitted to the hospital.   Subjective:    Laura [name] today has, No headache, No chest pain, No abdominal pain - No Nausea, No new weakness tingling or numbness, still reports dyspnea, mainly on exertion, improving   Assessment  & Plan :     Acute Hypoxic Resp. Failure due to Acute Covid 19 Viral Pneumonitis during the ongoing 2020 Covid 19 Pandemic  - she has moderate disease, has been started on IV steroids and Remdesivir will be monitored.  She has consented to Actemra use if needed, if clinically she gets worse Actemra will be used.  But so far her oxygen requirement remains on 3 L, so no indication for Actemra yet. - Encouraged the patient to sit up in chair in the daytime use I-S and flutter valve for pulmonary toiletry and then prone in bed when at night.  Will advance activity and titrate down oxygen as possible.  Actemra off label use - patient was told that if COVID-19 pneumonitis gets worse we might potentially use Actemra off label, patient denies any  known history of active diverticulitis, tuberculosis or hepatitis, understands the risks and benefits and wants to proceed with Actemra treatment if required.    SpO2: 94 % O2 Flow Rate (L/min): 3 L/min  Recent Labs  Lab 12/15/19 1500 12/15/19 1757 12/15/19 1822 12/16/19 0450 12/17/19 0342 12/18/19 0417  WBC 6.4  --   --  5.1 5.3 5.4  PLT PLATELET CLUMPS NOTED ON SMEAR, UNABLE TO ESTIMATE  --   --  PLATELET CLUMPS NOTED ON SMEAR, UNABLE TO ESTIMATE 228 284  CRP  --  15.9*  --  14.8* 10.6* 5.9*  DDIMER  --  1.48*  --  1.29* 0.83* 0.65*  PROCALCITON  --  <0.10  --   --   --   --   LATICACIDVEN 0.9  --  1.6  --   --   --   SARSCOV2NAA  --  POSITIVE*  --   --   --   --     Hepatic Function Latest Ref Rng & Units 12/18/2019 12/17/2019 12/16/2019  Total Protein 6.5 - 8.1 g/dL 6.1(L) 6.2(L) 6.2(L)  Albumin 3.5 - 5.0 g/dL 2.6(L) 2.5(L) 2.6(L)  AST 15 - 41 U/L 23 32 41  ALT 0 - 44 U/L _0 Alk Phosphatase 38 - 126 U/L 50 52 53  Total Bilirubin 0.3 - 1.2 mg/dL 0.8 0.6 0.7    AKI due to dehydration.  Hold diuretics and ARB, gentle hydration and monitor.  Renal function improving.  1.3 today, it was elevated 2.27 on admission  Hypothyroidism.  Home dose Synthroid.  Essential hypertension.  On beta-blocker at home dose.  GERD.  On PPI.  Morbid obesity.  BMI of 47.  Follow with PCP for weight loss.  DM type II.  Holding oral hypoglycemics, placed on Lantus twice daily 30 units along with sliding scale and Premeal NovoLog.  Lab Results  Component Value Date   HGBA1C 7.0 (H) 12/15/2019   CBG (last 3)  Recent Labs    12/17/19 2104 12/18/19 0732 12/18/19 1202  GLUCAP 209* 263* 323*     Condition -   Guarded  Family Communication  : Husband was updated by her request, he is hospitalized as well  Code Status :  Full  Consults  :  None  Procedures  :  None  PUD Prophylaxis : PPI  Disposition Plan  :    Status is: Inpatient  Remains inpatient appropriate  because:IV treatments appropriate due to intensity of illness or inability to take PO   Dispo: The patient is from: Home              Anticipated d/c is to: Home              Anticipated d/c date is: 2 days              Patient currently is not medically stable to d/c.   DVT Prophylaxis  :  Lovenox    Lab Results  Component Value Date   PLT 284 12/18/2019    Diet :  Diet Order            Diet clear liquid Room service appropriate? Yes; Fluid consistency: Thin  Diet effective now                  Inpatient Medications  Scheduled Meds: . albuterol  2 puff Inhalation Q6H  . vitamin C  500 mg Oral Daily  . aspirin  81 mg Oral Daily  . cholecalciferol  1,000 Units Oral Daily  . dexamethasone (DECADRON) injection  6 mg Intravenous Q24H  . enoxaparin (LOVENOX) injection  60 mg Subcutaneous Q24H  . insulin aspart  0-15 Units Subcutaneous TID WC  . insulin aspart  0-5 Units Subcutaneous QHS  . insulin aspart  3 Units Subcutaneous TID WC  . insulin glargine  36 Units Subcutaneous BID  . levothyroxine  225 mcg Oral Q0600  . metoprolol succinate  100 mg Oral Daily  . nystatin   Topical TID  . pantoprazole  40 mg Oral Daily  . sodium chloride flush  10-40 mL Intracatheter Q12H  . zinc sulfate  220 mg Oral Daily   Continuous Infusions: . remdesivir 100 mg in NS 100 mL 100 mg (12/18/19 0907)   PRN Meds:.acetaminophen, chlorpheniramine-HYDROcodone, guaiFENesin-dextromethorphan, loperamide, [DISCONTINUED] ondansetron **OR** ondansetron (ZOFRAN) IV, sodium chloride flush  Antibiotics  :    Anti-infectives (From admission, onward)   Start     Dose/Rate Route Frequency Ordered Stop   12/16/19 1000  remdesivir 100 mg in sodium chloride 0.9 % 100 mL IVPB     Discontinue    "Followed by" Linked Group Details   100 mg 200 mL/hr over 30 Minutes Intravenous Daily 12/15/19 1747 12/20/19 0959   12/15/19 1800  remdesivir 200 mg in sodium chloride 0.9% 250 mL IVPB       "Followed by"  Linked Group Details   200 mg 580 mL/hr over 30 Minutes Intravenous Once 12/15/19 1747 12/15/19 2121       Emeline Gins Maecy Podgurski M.D on 12/18/2019 at 12:05 PM  To page go to [email or web address] .   [facility] -  Office  [phone]   See all Orders from today for further details    Objective:   Vitals:   12/17/19 1621 12/17/19 2011 12/17/19 2026 12/18/19 0456  BP: (!) 106/44 110/69  (!) 140/55  Pulse: 62 63 57 55  Resp: _0 Temp: (!) 97.4 F (36.3 C) 97.8 F (36.6 C)  97.8 F (36.6 C)  TempSrc: Oral Oral  Oral  SpO2: 93% 97% 97% 94%  Weight:      Height:        Wt Readings from Last 3 Encounters:  12/16/19 (!) 121.2 kg  06/28/12 109.3 kg     Intake/Output Summary (Last 24 hours) at 12/18/2019 1205 Last data filed at 12/18/2019 0900 Gross per 24 hour  Intake 520 ml  Output --  Net 520 ml     Physical Exam  Awake Alert, Oriented X 3, No new F.N deficits, Normal affect Symmetrical Chest wall movement, Good air movement bilaterally, CTAB RRR,No Gallops,Rubs or new Murmurs, No Parasternal Heave +ve B.Sounds, Abd Soft, No tenderness, No rebound - guarding or rigidity. No Cyanosis, Clubbing or edema, No new Rash or bruise       Data Review:    CBC Recent Labs  Lab 12/15/19 1500 12/16/19 0450 12/17/19 0342 12/18/19 0417  WBC 6.4 5.1 5.3 5.4  HGB 10.7* 9.6* 10.2* 10.1*  HCT 35.3* 31.8* 33.2* 34.0*  PLT PLATELET CLUMPS NOTED ON SMEAR, UNABLE TO ESTIMATE PLATELET CLUMPS NOTED ON SMEAR, UNABLE TO ESTIMATE 228 284  MCV 89.1 87.6 87.6 88.8  MCH 27.0 26.4 26.9 26.4  MCHC 30.3 30.2 30.7 29.7*  RDW 16.3* 16.1* 15.9* 16.2*  LYMPHSABS 1.0 0.5* 1.1 0.8  MONOABS 0.4 0.1 0.4 0.3  EOSABS 0.0 0.0 0.0 0.0  BASOSABS 0.0 0.0 0.0 0.0    Chemistries  Recent Labs  Lab 12/15/19 1500 12/15/19 1806 12/15/19 1813 12/16/19 0450 12/17/19 0342 12/18/19 0417  NA 136  --   --  137 140 142  K 4.4  --   --  4.7 4.7 4.7  CL 100  --   --  104 106 107  CO2 24  --    --  21* 24  26  GLUCOSE 198*  --   --  341* 252* 254*  BUN 53*  --   --  51* 47* 43*  CREATININE 2.27*  --   --  1.70* 1.32* 1.33*  CALCIUM 8.2*  --   --  7.8* 8.4* 8.6*  AST 49*  --   --  41 32 23  ALT 27  --   --  _0 ALKPHOS 53  --   --  53 52 50  BILITOT 0.7  --   --  0.7 0.6 0.8  MG  --   --   --  1.9 2.1 2.1  TSH  --   --  2.155  --   --   --   HGBA1C  --  7.0*  --   --   --   --      ------------------------------------------------------------------------------------------------------------------ No results for input(s): CHOL, HDL, LDLCALC, TRIG, CHOLHDL, LDLDIRECT in the last 72 hours.  Lab Results  Component Value Date   HGBA1C 7.0 (H) 12/15/2019   ------------------------------------------------------------------------------------------------------------------ Recent Labs    12/15/19 1813  TSH 2.155    Cardiac Enzymes No results for input(s): CKMB, TROPONINI, MYOGLOBIN in the last 168 hours.  Invalid input(s): CK ------------------------------------------------------------------------------------------------------------------    Component Value Date/Time   BNP 178.1 (H) 12/18/2019 0417    Micro Results Recent Results (from the past 240 hour(s))  SARS Coronavirus 2 by RT PCR (hospital order, performed in [facility] hospital lab) Nasopharyngeal Nasopharyngeal Swab     Status: Abnormal   Collection Time: 12/15/19  5:57 PM   Specimen: Nasopharyngeal Swab  Result Value Ref Range Status   SARS Coronavirus 2 POSITIVE (A) NEGATIVE Final    Comment: RESULT CALLED TO, READ BACK BY AND VERIFIED WITH: E,ROBERTSON _1  12/15/19 EB (NOTE) SARS-CoV-2 target nucleic acids are DETECTED  SARS-CoV-2 RNA is generally detectable in upper respiratory specimens  during the acute phase of infection.  Positive results are indicative  of the presence of the identified virus, but do not rule out bacterial infection or co-infection with other pathogens not detected by the  test.  Clinical correlation with patient history and  other diagnostic information is necessary to determine patient infection status.  The expected result is negative.  Fact Sheet for Patients:   StrictlyIdeas.no   Fact Sheet for Healthcare Providers:   BankingDealers.co.za    This test is not yet approved or cleared by the [place] FDA and  has been authorized for detection and/or diagnosis of SARS-CoV-2 by FDA under an Emergency Use Authorization (EUA).  This EUA will remain in effect (meaning this test can  be used) for the duration of  the COVID-19 declaration under Section 564(b)(1) of the Act, 21 U.S.C. section 360-bbb-3(b)(1), unless the authorization is terminated or revoked sooner.  Performed at [facility], [place] [address] [place], [place] [address]   Culture, blood (Routine X 2) w Reflex to ID Panel     Status: None (Preliminary result)   Collection Time: 12/15/19  5:58 PM   Specimen: BLOOD RIGHT FOREARM  Result Value Ref Range Status   Specimen Description BLOOD RIGHT FOREARM  Final   Special Requests   Final    BOTTLES DRAWN AEROBIC AND ANAEROBIC Blood Culture results may not be optimal due to an inadequate volume of blood received in culture bottles   Culture   Final    NO GROWTH 3 DAYS Performed at [facility], [place] [address],  [place], [place] [address]    Report Status PENDING  Incomplete  Culture, blood (Routine X 2) w Reflex to ID Panel     Status: None (Preliminary result)   Collection Time: 12/15/19  6:01 PM   Specimen: BLOOD  Result Value Ref Range Status   Specimen Description BLOOD SITE NOT SPECIFIED  Final   Special Requests   Final    BOTTLES DRAWN AEROBIC AND ANAEROBIC Blood Culture results may not be optimal due to an inadequate volume of blood received in culture bottles   Culture   Final    NO GROWTH 3 DAYS Performed at [facility], [place] [address] [place], [place]  [address]    Report Status PENDING  Incomplete    Radiology Reports DG Chest Portable 1 View  Result Date: 12/15/2019 CLINICAL DATA:  69 year old female with shortness of breath. Positive COVID-19. EXAM: PORTABLE CHEST 1 VIEW COMPARISON:  Chest radiograph dated 09/10/2017. FINDINGS: Bilateral faint streaky densities primarily involving the peripheral and subpleural region of the mid to lower lung field most consistent with pneumonia, likely viral or atypical in etiology. Clinical correlation and follow-up recommended. No lobar consolidation, pleural effusion, pneumothorax. Mild cardiomegaly. No acute osseous pathology. IMPRESSION: 1. Multifocal pneumonia, likely viral or atypical in etiology. Clinical correlation and follow-up recommended. 2. Mild cardiomegaly. Electronically Signed   By: Anner Crete M.D.   On: 12/15/2019 15:14

## 2019-12-18 NOTE — Evaluation (Signed)
Physical Therapy Evaluation Patient Details Name: Laura [name] MRN: [id number] DOB: 08/22/1950 Today's Date: 12/18/2019   History of Present Illness  69 y.o. female admitted on 12/15/19 for SOB, diarrhea, and fatigue.  Pt dx with COVID 19 a week PTA.  Pt here with acute hypoxic respiratory failure, AKI d/t dehydration.  Pt with significant PMH of DM2, morbid obesity, hypothyroidism, HTN, anxiety, and foot surgery.  Clinical Impression  Pt admitted with/for SOB, fatigue from COVID.  Pt at min guard level overall and remains easily fatigued with sats dropping into upper 80's with activity..  Pt currently limited functionally due to the problems listed below.  (see problems list.)  Pt will benefit from PT to maximize function and safety to be able to get home safely with available assist.     Follow Up Recommendations No PT follow up;Supervision/Assistance - 24 hour;Supervision - Intermittent    Equipment Recommendations  None recommended by PT    Recommendations for Other Services       Precautions / Restrictions Precautions Precautions: Fall Precaution Comments: watch sats      Mobility  Bed Mobility               General bed mobility comments: in recliner on arrival  Transfers Overall transfer level: Needs assistance Equipment used: 1 person hand held assist Transfers: Stand Pivot Transfers;Sit to/from Stand Sit to Stand: Min guard;From elevated surface Stand pivot transfers: Min guard       General transfer comment: cuing for hand placement/safety  Ambulation/Gait Ambulation/Gait assistance: Min guard Gait Distance (Feet): 100 Feet (x2 with 2 standing rest breaks) Assistive device: Rolling walker (2 wheeled) Gait Pattern/deviations: Step-through pattern Gait velocity: slower Gait velocity interpretation: 1.31 - 2.62 ft/sec, indicative of limited community ambulator General Gait Details: steady, but guarded.  Sats difficult to read, but with good waveform sats  somewhere between 86-90% on 2L and 88->90% on 3L  HR in the upper 80's  Stairs            Wheelchair Mobility    Modified Rankin (Stroke Patients Only)       Balance Overall balance assessment: Needs assistance Sitting-balance support: No upper extremity supported;Feet supported Sitting balance-Leahy Scale: Fair     Standing balance support: Single extremity supported;During functional activity Standing balance-Leahy Scale: Fair Standing balance comment: able to stand statically without assist                             Pertinent Vitals/Pain Pain Assessment: No/denies pain    Home Living Family/patient expects to be discharged to:: Private residence Living Arrangements: Spouse/significant other Available Help at Discharge: Family;Other (Comment) (Daughter near, Husband works M-F 6-2:30) Type of Home: Mobile home Home Access: Stairs to enter Entrance Stairs-Rails: Right Entrance Stairs-Number of Steps: 5 Home Layout: One level Home Equipment: Shower seat;Cane - single point      Prior Function Level of Independence: Independent with assistive device(s)         Comments: Husband supervises getting in/out of shower.  Uses SPC for mobility     Hand Dominance   Dominant Hand: Right    Extremity/Trunk Assessment   Upper Extremity Assessment Upper Extremity Assessment: Defer to OT evaluation    Lower Extremity Assessment Lower Extremity Assessment: Generalized weakness       Communication   Communication: No difficulties  Cognition Arousal/Alertness: Awake/alert Behavior During Therapy: WFL for tasks assessed/performed Overall Cognitive Status: Within Functional Limits for  tasks assessed                                        General Comments      Exercises     Assessment/Plan    PT Assessment Patient needs continued PT services  PT Problem List Decreased strength;Decreased activity tolerance;Decreased  mobility;Cardiopulmonary status limiting activity       PT Treatment Interventions DME instruction;Gait training;Stair training;Functional mobility training;Therapeutic activities;Patient/family education    PT Goals (Current goals can be found in the Care Plan section)  Acute Rehab PT Goals Patient Stated Goal: to get stronger PT Goal Formulation: With patient Time For Goal Achievement: 01/01/20 Potential to Achieve Goals: Good Additional Goals Additional Goal #1: Manage the above activity on  RA at >=90%    Frequency Min 3X/week   Barriers to discharge        Co-evaluation               AM-PAC PT "6 Clicks" Mobility  Outcome Measure Help needed turning from your back to your side while in a flat bed without using bedrails?: A Little Help needed moving from lying on your back to sitting on the side of a flat bed without using bedrails?: A Little Help needed moving to and from a bed to a chair (including a wheelchair)?: A Little Help needed standing up from a chair using your arms (e.g., wheelchair or bedside chair)?: A Little Help needed to walk in hospital room?: A Little Help needed climbing 3-5 steps with a railing? : A Little 6 Click Score: 18    End of Session   Activity Tolerance: Patient tolerated treatment well   Nurse Communication: Mobility status PT Visit Diagnosis: Other abnormalities of gait and mobility (R26.89);Difficulty in walking, not elsewhere classified (R26.2)    Time: 6222-9798 (1145) PT Time Calculation (min) (ACUTE ONLY): 36 min   Charges:   PT Evaluation $PT Eval Moderate Complexity: 1 Mod PT Treatments $Gait Training: 8-22 mins        12/18/2019  Ginger Carne., PT Acute Rehabilitation Services [phone]  (pager) [phone]  (office)  Laura [name] 12/18/2019, 1:38 PM

## 2019-12-19 LAB — CBC WITH DIFFERENTIAL/PLATELET
Abs Immature Granulocytes: 0.1 10*3/uL — ABNORMAL HIGH (ref 0.00–0.07)
Basophils Absolute: 0 10*3/uL (ref 0.0–0.1)
Basophils Relative: 0 %
Eosinophils Absolute: 0 10*3/uL (ref 0.0–0.5)
Eosinophils Relative: 0 %
HCT: 35.4 % — ABNORMAL LOW (ref 36.0–46.0)
Hemoglobin: 10.6 g/dL — ABNORMAL LOW (ref 12.0–15.0)
Immature Granulocytes: 2 %
Lymphocytes Relative: 21 %
Lymphs Abs: 1.3 10*3/uL (ref 0.7–4.0)
MCH: 27.1 pg (ref 26.0–34.0)
MCHC: 29.9 g/dL — ABNORMAL LOW (ref 30.0–36.0)
MCV: 90.5 fL (ref 80.0–100.0)
Monocytes Absolute: 0.6 10*3/uL (ref 0.1–1.0)
Monocytes Relative: 10 %
Neutro Abs: 4.2 10*3/uL (ref 1.7–7.7)
Neutrophils Relative %: 67 %
Platelets: 255 10*3/uL (ref 150–400)
RBC: 3.91 MIL/uL (ref 3.87–5.11)
RDW: 16.1 % — ABNORMAL HIGH (ref 11.5–15.5)
WBC: 6.3 10*3/uL (ref 4.0–10.5)
nRBC: 0.3 % — ABNORMAL HIGH (ref 0.0–0.2)

## 2019-12-19 LAB — COMPREHENSIVE METABOLIC PANEL
ALT: 23 U/L (ref 0–44)
AST: 22 U/L (ref 15–41)
Albumin: 2.8 g/dL — ABNORMAL LOW (ref 3.5–5.0)
Alkaline Phosphatase: 53 U/L (ref 38–126)
Anion gap: 9 (ref 5–15)
BUN: 41 mg/dL — ABNORMAL HIGH (ref 8–23)
CO2: 28 mmol/L (ref 22–32)
Calcium: 9 mg/dL (ref 8.9–10.3)
Chloride: 106 mmol/L (ref 98–111)
Creatinine, Ser: 1.25 mg/dL — ABNORMAL HIGH (ref 0.44–1.00)
GFR calc Af Amer: 52 mL/min — ABNORMAL LOW (ref >60)
GFR calc non Af Amer: 44 mL/min — ABNORMAL LOW (ref >60)
Glucose, Bld: 250 mg/dL — ABNORMAL HIGH (ref 70–99)
Potassium: 4.5 mmol/L (ref 3.5–5.1)
Sodium: 143 mmol/L (ref 135–145)
Total Bilirubin: 0.4 mg/dL (ref 0.3–1.2)
Total Protein: 6.3 g/dL — ABNORMAL LOW (ref 6.5–8.1)

## 2019-12-19 LAB — D-DIMER, QUANTITATIVE: D-Dimer, Quant: 0.64 ug/mL-FEU — ABNORMAL HIGH (ref 0.00–0.50)

## 2019-12-19 LAB — GLUCOSE, CAPILLARY
Glucose-Capillary: 195 mg/dL — ABNORMAL HIGH (ref 70–99)
Glucose-Capillary: 239 mg/dL — ABNORMAL HIGH (ref 70–99)
Glucose-Capillary: 189 mg/dL — ABNORMAL HIGH (ref 70–99)
Glucose-Capillary: 109 mg/dL — ABNORMAL HIGH (ref 70–99)

## 2019-12-19 LAB — BRAIN NATRIURETIC PEPTIDE: B Natriuretic Peptide: 95.5 pg/mL (ref 0.0–100.0)

## 2019-12-19 LAB — C-REACTIVE PROTEIN: CRP: 3.8 mg/dL — ABNORMAL HIGH (ref <1.0)

## 2019-12-19 LAB — MAGNESIUM: Magnesium: 1.9 mg/dL (ref 1.7–2.4)

## 2019-12-19 MED ORDER — INSULIN ASPART 100 UNIT/ML ~~LOC~~ SOLN
6.0000 [IU] | Freq: Three times a day (TID) | SUBCUTANEOUS | Status: DC
  Administered 2019-12-19 – 2019-12-20 (×3): 6 [IU] via SUBCUTANEOUS

## 2019-12-19 MED ORDER — INSULIN GLARGINE 100 UNIT/ML ~~LOC~~ SOLN
40.0000 [IU] | Freq: Two times a day (BID) | SUBCUTANEOUS | Status: DC
  Administered 2019-12-19 – 2019-12-20 (×2): 40 [IU] via SUBCUTANEOUS
  Filled 2019-12-19 (×3): qty 0.4

## 2019-12-19 NOTE — Progress Notes (Signed)
Occupational Therapy Treatment Patient Details Name: Laura [name] MRN: [id number] DOB: Jun 02, 1950 Today's Date: 12/19/2019    History of present illness 69 y.o. female admitted on 12/15/19 for SOB, diarrhea, and fatigue.  Pt dx with COVID 19 a week PTA.  Pt here with acute hypoxic respiratory failure, AKI d/t dehydration.  Pt with significant PMH of DM2, morbid obesity, hypothyroidism, HTN, anxiety, and foot surgery.   OT comments  Patient initially in husband's room (he is a patient here). After completing session with husband, began session with patient standing with supervision and walking back to her room.  Walk ~1109ft with 2L O2.  Patient continues to have decreased activity tolerance as she gets some shortness of breath (dypsnea scale 2/4) with walking and standing activity.  SpO2 decreased to 84 by end of walk, though patient able to recover quickly with seated rest break.  Stood for grooming at sink ~5 min.  Worked on breathing exercises.  Will continue to follow with OT acutely to address the deficits listed below.    Follow Up Recommendations  No OT follow up;Supervision - Intermittent    Equipment Recommendations  3 in 1 bedside commode    Recommendations for Other Services      Precautions / Restrictions Precautions Precautions: Fall Precaution Comments: watch sats Restrictions Weight Bearing Restrictions: No       Mobility Bed Mobility                  Transfers Overall transfer level: Needs assistance Equipment used: Rolling walker (2 wheeled) Transfers: Sit to/from Stand Sit to Stand: Supervision              Balance Overall balance assessment: Needs assistance Sitting-balance support: No upper extremity supported;Feet supported Sitting balance-Leahy Scale: Fair     Standing balance support: Bilateral upper extremity supported;During functional activity Standing balance-Leahy Scale: Fair Standing balance comment: Able to stand at sink for  grooming, used RW for extra stability with walking                           ADL either performed or assessed with clinical judgement   ADL Overall ADL's : Needs assistance/impaired     Grooming: Supervision/safety;Sitting;Standing                               Functional mobility during ADLs: Supervision/safety;Rolling walker       Vision       Perception     Praxis      Cognition Arousal/Alertness: Awake/alert Behavior During Therapy: WFL for tasks assessed/performed Overall Cognitive Status: Within Functional Limits for tasks assessed                                          Exercises Exercises: Other exercises Other Exercises Other Exercises: 15ft walk in hall Other Exercises: x10 incentive spirometer (pulled 1250)   Shoulder Instructions       General Comments      Pertinent Vitals/ Pain       Pain Assessment: No/denies pain  Home Living  Prior Functioning/Environment              Frequency  Min 2X/week        Progress Toward Goals  OT Goals(current goals can now be found in the care plan section)  Progress towards OT goals: Progressing toward goals  Acute Rehab OT Goals Patient Stated Goal: to get stronger OT Goal Formulation: With patient Time For Goal Achievement: 12/31/19 Potential to Achieve Goals: Good  Plan Discharge plan needs to be updated    Co-evaluation                 AM-PAC OT "6 Clicks" Daily Activity     Outcome Measure   Help from another person eating meals?: None Help from another person taking care of personal grooming?: A Little Help from another person toileting, which includes using toliet, bedpan, or urinal?: A Little Help from another person bathing (including washing, rinsing, drying)?: A Little Help from another person to put on and taking off regular upper body clothing?: A Little Help from  another person to put on and taking off regular lower body clothing?: A Little 6 Click Score: 19    End of Session Equipment Utilized During Treatment: Oxygen  OT Visit Diagnosis: Unsteadiness on feet (R26.81);Muscle weakness (generalized) (M62.81);Other (comment) (decreased cardiopulminary status)   Activity Tolerance Patient tolerated treatment well   Patient Left in chair;with call bell/phone within reach   Nurse Communication Mobility status        Time: 7276-1848 OT Time Calculation (min): 19 min  Charges: OT General Charges $OT Visit: 1 Visit OT Treatments $Self Care/Home Management : 8-22 mins  August Luz, OTR/L    Phylliss Bob 12/19/2019, 2:23 PM

## 2019-12-19 NOTE — Progress Notes (Signed)
SATURATION QUALIFICATIONS: (This note is used to comply with regulatory documentation for home oxygen)  Patient Saturations on Room Air at Rest = 95%  Patient Saturations on Room Air while Ambulating = 85%  Patient Saturations on 2 Liters of oxygen while Ambulating = 90%  Please briefly explain why patient needs home oxygen: Pt still requiring oxygen at this time to keep O2 sats above 88%

## 2019-12-19 NOTE — Progress Notes (Signed)
PROGRESS NOTE                                                                                                                                                                                                             Patient Demographics:    Laura [name], is a 69 y.o. female, DOB - 09/14/1950, VOJ:[id number]  Outpatient Primary MD for the patient is Laura Noon, MD    LOS - 4  Admit date - 12/15/2019    Chief Complaint  Patient presents with  . Shortness of Breath  . Fatigue       Brief Narrative - Laura [name] is a 69 y.o. female with medical history significant of hypertension, GERD, hypothyroidism, diabetes mellitus, obesity presents to emergency department with worsening cough, shortness of breath and diarrhea since 1 week, he was diagnosed with COVID-19 infection about a week before ER visit.  In the ER she was diagnosed with COVID-19 pneumonia and admitted to the hospital.   Subjective:    Laura [name] today has, No headache, No chest pain, No abdominal pain - No Nausea, No new weakness tingling or numbness, ports dyspnea has improved, she tolerated ambulation yesterday in the hallway with 2 L nasal cannula.  Assessment  & Plan :     Acute Hypoxic Resp. Failure due to Acute Covid 19 Viral Pneumonitis during the ongoing 2020 Covid 19 Pandemic  - she has moderate disease, has been started on IV steroids and Remdesivir will be monitored.  She has consented to Actemra use if needed, if clinically she gets worse Actemra will be used.  But so far her oxygen requirement remains on 2 L, so no indication for Actemra yet. - Encouraged the patient to sit up in chair in the daytime use I-S and flutter valve for pulmonary toiletry and then prone in bed when at night.  Will advance activity and titrate down oxygen as possible.  Actemra off label use - patient was told that if COVID-19 pneumonitis gets worse we might  potentially use Actemra off label, patient denies any known history of active diverticulitis, tuberculosis or hepatitis, understands the risks and benefits and wants to proceed with Actemra treatment if required.    SpO2: 97 % O2 Flow Rate (L/min): 3 L/min  Recent Labs  Lab 12/15/19 1500 12/15/19 1757 12/15/19 1822 12/16/19 0450  12/17/19 0342 12/18/19 0417 12/19/19 0434 12/19/19 0617  WBC 6.4  --   --  5.1 5.3 5.4  --  6.3  PLT PLATELET CLUMPS NOTED ON SMEAR, UNABLE TO ESTIMATE  --   --  PLATELET CLUMPS NOTED ON SMEAR, UNABLE TO ESTIMATE 228 284  --  255  CRP  --  15.9*  --  14.8* 10.6* 5.9* 3.8*  --   DDIMER  --  1.48*  --  1.29* 0.83* 0.65* 0.64*  --   PROCALCITON  --  <0.10  --   --   --   --   --   --   LATICACIDVEN 0.9  --  1.6  --   --   --   --   --   SARSCOV2NAA  --  POSITIVE*  --   --   --   --   --   --     Hepatic Function Latest Ref Rng & Units 12/19/2019 12/18/2019 12/17/2019  Total Protein 6.5 - 8.1 g/dL 6.3(L) 6.1(L) 6.2(L)  Albumin 3.5 - 5.0 g/dL 2.8(L) 2.6(L) 2.5(L)  AST 15 - 41 U/L 22 23 32  ALT 0 - 44 U/L 23 24 26   Alk Phosphatase 38 - 126 U/L 53 50 52  Total Bilirubin 0.3 - 1.2 mg/dL 0.4 0.8 0.6    AKI due to dehydration.  -  Hold diuretics and ARB, gentle hydration and monitor.  Renal function improving.  1.25 today, it was elevated 2.27 on admission  Hypothyroidism.  Home dose Synthroid.  Essential hypertension.  On beta-blocker at home dose.  GERD.  On PPI.  Morbid obesity.  BMI of 47.  Follow with PCP for weight loss.  Skin candidiasis -Continue with nystatin powder  DM type II. Remains elevated, will increase Lantus to 40 units twice daily, continue with insulin sliding scale, and 6 units of NovoLog before meals .  Lab Results  Component Value Date   HGBA1C 7.0 (H) 12/15/2019   CBG (last 3)  Recent Labs    12/18/19 2130 12/19/19 0733 12/19/19 1156  GLUCAP 209* 195* 239*     Condition -   Guarded  Family Communication  : Husband  was updated by her request, he is hospitalized as well  Code Status :  Full  Consults  :  None  Procedures  :  None  PUD Prophylaxis : PPI  Disposition Plan  :    Status is: Inpatient  Remains inpatient appropriate because:IV treatments appropriate due to intensity of illness or inability to take PO   Dispo: The patient is from: Home              Anticipated d/c is to: Home,  will need home oxygen on discharge.              Anticipated d/c date is: 1 day              Patient currently is not medically stable to d/c.   DVT Prophylaxis  :  Lovenox    Lab Results  Component Value Date   PLT 255 12/19/2019    Diet :  Diet Order            Diet heart healthy/carb modified Room service appropriate? No; Fluid consistency: Thin  Diet effective now                  Inpatient Medications  Scheduled Meds: . albuterol  2 puff Inhalation Q6H  . vitamin C  500 mg Oral Daily  . aspirin  81 mg Oral Daily  . cholecalciferol  1,000 Units Oral Daily  . dexamethasone (DECADRON) injection  6 mg Intravenous Q24H  . enoxaparin (LOVENOX) injection  60 mg Subcutaneous Q24H  . insulin aspart  0-15 Units Subcutaneous TID WC  . insulin aspart  0-5 Units Subcutaneous QHS  . insulin aspart  3 Units Subcutaneous TID WC  . insulin glargine  36 Units Subcutaneous BID  . levothyroxine  225 mcg Oral Q0600  . metoprolol succinate  100 mg Oral Daily  . nystatin   Topical TID  . pantoprazole  40 mg Oral Daily  . sodium chloride flush  10-40 mL Intracatheter Q12H  . zinc sulfate  220 mg Oral Daily   Continuous Infusions:  PRN Meds:.acetaminophen, chlorpheniramine-HYDROcodone, guaiFENesin-dextromethorphan, loperamide, [DISCONTINUED] ondansetron **OR** ondansetron (ZOFRAN) IV, sodium chloride flush  Antibiotics  :    Anti-infectives (From admission, onward)   Start     Dose/Rate Route Frequency Ordered Stop   12/16/19 1000  remdesivir 100 mg in sodium chloride 0.9 % 100 mL IVPB         "Followed by" Linked Group Details   100 mg 200 mL/hr over 30 Minutes Intravenous Daily 12/15/19 1747 12/19/19 0957   12/15/19 1800  remdesivir 200 mg in sodium chloride 0.9% 250 mL IVPB       "Followed by" Linked Group Details   200 mg 580 mL/hr over 30 Minutes Intravenous Once 12/15/19 1747 12/15/19 2121       Emeline Gins Ilina Xu M.D on 12/19/2019 at 2:23 PM  To page go to [email or web address] .   [facility] -  Office  [phone]   See all Orders from today for further details    Objective:   Vitals:   12/18/19 1409 12/18/19 2010 12/19/19 0439 12/19/19 0730  BP: (!) 117/48 (!) 145/61 (!) 148/56 (!) 137/114  Pulse: 64 65 69 63  Resp: 22 17 18 17   Temp: 97.6 F (36.4 C) 97.6 F (36.4 C) 97.7 F (36.5 C) 98.1 F (36.7 C)  TempSrc: Oral Oral Oral Oral  SpO2: 92% 94% 98% 97%  Weight:      Height:        Wt Readings from Last 3 Encounters:  12/16/19 (!) 121.2 kg  06/28/12 109.3 kg     Intake/Output Summary (Last 24 hours) at 12/19/2019 1423 Last data filed at 12/18/2019 2000 Gross per 24 hour  Intake 200 ml  Output --  Net 200 ml     Physical Exam  Awake Alert, Oriented X 3, No new F.N deficits, Normal affect Symmetrical Chest wall movement, Good air movement bilaterally, CTAB RRR,No Gallops,Rubs or new Murmurs, No Parasternal Heave +ve B.Sounds, Abd Soft, No tenderness, No rebound - guarding or rigidity. No Cyanosis, Clubbing or edema, No new Rash or bruise        Data Review:    CBC Recent Labs  Lab 12/15/19 1500 12/16/19 0450 12/17/19 0342 12/18/19 0417 12/19/19 0617  WBC 6.4 5.1 5.3 5.4 6.3  HGB 10.7* 9.6* 10.2* 10.1* 10.6*  HCT 35.3* 31.8* 33.2* 34.0* 35.4*  PLT PLATELET CLUMPS NOTED ON SMEAR, UNABLE TO ESTIMATE PLATELET CLUMPS NOTED ON SMEAR, UNABLE TO ESTIMATE 228 284 255  MCV 89.1 87.6 87.6 88.8 90.5  MCH 27.0 26.4 26.9 26.4 27.1  MCHC 30.3 30.2 30.7 29.7* 29.9*  RDW 16.3* 16.1* 15.9* 16.2* 16.1*  LYMPHSABS 1.0 0.5* 1.1 0.8 1.3   MONOABS 0.4 0.1 0.4 0.3 0.6  EOSABS 0.0  0.0 0.0 0.0 0.0  BASOSABS 0.0 0.0 0.0 0.0 0.0    Chemistries  Recent Labs  Lab 12/15/19 1500 12/15/19 1806 12/15/19 1813 12/16/19 0450 12/17/19 0342 12/18/19 0417 12/19/19 0434  NA 136  --   --  137 140 142 143  K 4.4  --   --  4.7 4.7 4.7 4.5  CL 100  --   --  104 106 107 106  CO2 24  --   --  21* 24 26 28   GLUCOSE 198*  --   --  341* 252* 254* 250*  BUN 53*  --   --  51* 47* 43* 41*  CREATININE 2.27*  --   --  1.70* 1.32* 1.33* 1.25*  CALCIUM 8.2*  --   --  7.8* 8.4* 8.6* 9.0  AST 49*  --   --  41 32 23 22  ALT 27  --   --  27 26 24 23   ALKPHOS 53  --   --  53 52 50 53  BILITOT 0.7  --   --  0.7 0.6 0.8 0.4  MG  --   --   --  1.9 2.1 2.1 1.9  TSH  --   --  2.155  --   --   --   --   HGBA1C  --  7.0*  --   --   --   --   --      ------------------------------------------------------------------------------------------------------------------ No results for input(s): CHOL, HDL, LDLCALC, TRIG, CHOLHDL, LDLDIRECT in the last 72 hours.  Lab Results  Component Value Date   HGBA1C 7.0 (H) 12/15/2019   ------------------------------------------------------------------------------------------------------------------ No results for input(s): TSH, T4TOTAL, T3FREE, THYROIDAB in the last 72 hours.  Invalid input(s): FREET3  Cardiac Enzymes No results for input(s): CKMB, TROPONINI, MYOGLOBIN in the last 168 hours.  Invalid input(s): CK ------------------------------------------------------------------------------------------------------------------    Component Value Date/Time   BNP 95.5 12/19/2019 0424    Micro Results Recent Results (from the past 240 hour(s))  SARS Coronavirus 2 by RT PCR (hospital order, performed in [facility] hospital lab) Nasopharyngeal Nasopharyngeal Swab     Status: Abnormal   Collection Time: 12/15/19  5:57 PM   Specimen: Nasopharyngeal Swab  Result Value Ref Range Status   SARS Coronavirus 2  POSITIVE (A) NEGATIVE Final    Comment: RESULT CALLED TO, READ BACK BY AND VERIFIED WITH: E,ROBERTSON @1935  12/15/19 EB (NOTE) SARS-CoV-2 target nucleic acids are DETECTED  SARS-CoV-2 RNA is generally detectable in upper respiratory specimens  during the acute phase of infection.  Positive results are indicative  of the presence of the identified virus, but do not rule out bacterial infection or co-infection with other pathogens not detected by the test.  Clinical correlation with patient history and  other diagnostic information is necessary to determine patient infection status.  The expected result is negative.  Fact Sheet for Patients:   StrictlyIdeas.no   Fact Sheet for Healthcare Providers:   BankingDealers.co.za    This test is not yet approved or cleared by the [place] FDA and  has been authorized for detection and/or diagnosis of SARS-CoV-2 by FDA under an Emergency Use Authorization (EUA).  This EUA will remain in effect (meaning this test can  be used) for the duration of  the COVID-19 declaration under Section 564(b)(1) of the Act, 21 U.S.C. section 360-bbb-3(b)(1), unless the authorization is terminated or revoked sooner.  Performed at [facility], [place] [address] [place], [place] [address]  Culture, blood (Routine X 2) w Reflex to ID Panel     Status: None (Preliminary result)   Collection Time: 12/15/19  5:58 PM   Specimen: BLOOD RIGHT FOREARM  Result Value Ref Range Status   Specimen Description BLOOD RIGHT FOREARM  Final   Special Requests   Final    BOTTLES DRAWN AEROBIC AND ANAEROBIC Blood Culture results may not be optimal due to an inadequate volume of blood received in culture bottles   Culture   Final    NO GROWTH 3 DAYS Performed at [facility], [place] [address], [place], [place] [address]    Report Status PENDING  Incomplete  Culture, blood (Routine X 2) w Reflex to ID Panel     Status:  None (Preliminary result)   Collection Time: 12/15/19  6:01 PM   Specimen: BLOOD  Result Value Ref Range Status   Specimen Description BLOOD SITE NOT SPECIFIED  Final   Special Requests   Final    BOTTLES DRAWN AEROBIC AND ANAEROBIC Blood Culture results may not be optimal due to an inadequate volume of blood received in culture bottles   Culture   Final    NO GROWTH 3 DAYS Performed at [facility], [place] [address] [place], [place] [address]    Report Status PENDING  Incomplete    Radiology Reports DG Chest Portable 1 View  Result Date: 12/15/2019 CLINICAL DATA:  69 year old female with shortness of breath. Positive COVID-19. EXAM: PORTABLE CHEST 1 VIEW COMPARISON:  Chest radiograph dated 09/10/2017. FINDINGS: Bilateral faint streaky densities primarily involving the peripheral and subpleural region of the mid to lower lung field most consistent with pneumonia, likely viral or atypical in etiology. Clinical correlation and follow-up recommended. No lobar consolidation, pleural effusion, pneumothorax. Mild cardiomegaly. No acute osseous pathology. IMPRESSION: 1. Multifocal pneumonia, likely viral or atypical in etiology. Clinical correlation and follow-up recommended. 2. Mild cardiomegaly. Electronically Signed   By: Anner Crete M.D.   On: 12/15/2019 15:14

## 2019-12-19 NOTE — Progress Notes (Signed)
Inpatient Diabetes Program Recommendations  AACE/ADA: New Consensus Statement on Inpatient Glycemic Control (2015)  Target Ranges:  Prepandial:   less than 140 mg/dL      Peak postprandial:   less than 180 mg/dL (1-2 hours)      Critically ill patients:  140 - 180 mg/dL   Lab Results  Component Value Date   GLUCAP 195 (H) 12/19/2019   HGBA1C 7.0 (H) 12/15/2019    Review of Glycemic Control Results for Laura [name], Laura [name] (MRN [id number]) as of 12/19/2019 10:59  Ref. Range 12/18/2019 12:02 12/18/2019 16:17 12/18/2019 21:30 12/19/2019 07:33  Glucose-Capillary Latest Ref Range: 70 - 99 mg/dL 323 (H) 276 (H) 209 (H) 195 (H)   Diabetes history: Type 2 DM Outpatient Diabetes medications: Trulicity 0.26 mg Qwk, Actos 30 mg QD, Glipizide 10 mg QAM Current orders for Inpatient glycemic control: Lantus 30 units BID, Novolog 0-15 units TID, Novolog 0-5 units QHS, Novolog 3 units TID Decadron 6 mg QD  Inpatient Diabetes Program Recommendations:    Consider increasing Lantus 40 units BID and Novolog 6 units TID (Assuming patient is consuming >50% of meal).   Thanks, Bronson Curb, MSN, RNC-OB Diabetes Coordinator [phone] (8a-5p)

## 2019-12-19 NOTE — Progress Notes (Addendum)
CSW received consult regarding home oxygen. CSW spoke with patient and provided agency choice. She is accepting of [place]. CSW sent information to [employer]. CSW confirmed address and phone number. Her daughter will be the contact for delivery as patient's spouse is also hospitalized. She reports patient's daughter will transport her by car.   Collyns Mcquigg LCSW

## 2019-12-20 LAB — COMPREHENSIVE METABOLIC PANEL
ALT: 24 U/L (ref 0–44)
AST: 23 U/L (ref 15–41)
Albumin: 2.5 g/dL — ABNORMAL LOW (ref 3.5–5.0)
Alkaline Phosphatase: 46 U/L (ref 38–126)
Anion gap: 9 (ref 5–15)
BUN: 38 mg/dL — ABNORMAL HIGH (ref 8–23)
CO2: 27 mmol/L (ref 22–32)
Calcium: 8.6 mg/dL — ABNORMAL LOW (ref 8.9–10.3)
Chloride: 108 mmol/L (ref 98–111)
Creatinine, Ser: 1.28 mg/dL — ABNORMAL HIGH (ref 0.44–1.00)
GFR calc Af Amer: 50 mL/min — ABNORMAL LOW (ref >60)
GFR calc non Af Amer: 43 mL/min — ABNORMAL LOW (ref >60)
Glucose, Bld: 127 mg/dL — ABNORMAL HIGH (ref 70–99)
Potassium: 4.1 mmol/L (ref 3.5–5.1)
Sodium: 144 mmol/L (ref 135–145)
Total Bilirubin: 0.5 mg/dL (ref 0.3–1.2)
Total Protein: 5.9 g/dL — ABNORMAL LOW (ref 6.5–8.1)

## 2019-12-20 LAB — D-DIMER, QUANTITATIVE: D-Dimer, Quant: 0.41 ug/mL-FEU (ref 0.00–0.50)

## 2019-12-20 LAB — CULTURE, BLOOD (ROUTINE X 2)
Culture: NO GROWTH
Culture: NO GROWTH

## 2019-12-20 LAB — GLUCOSE, CAPILLARY
Glucose-Capillary: 101 mg/dL — ABNORMAL HIGH (ref 70–99)
Glucose-Capillary: 188 mg/dL — ABNORMAL HIGH (ref 70–99)

## 2019-12-20 LAB — CBC WITH DIFFERENTIAL/PLATELET
Abs Immature Granulocytes: 0.11 10*3/uL — ABNORMAL HIGH (ref 0.00–0.07)
Basophils Absolute: 0 10*3/uL (ref 0.0–0.1)
Basophils Relative: 0 %
Eosinophils Absolute: 0 10*3/uL (ref 0.0–0.5)
Eosinophils Relative: 0 %
HCT: 32.9 % — ABNORMAL LOW (ref 36.0–46.0)
Hemoglobin: 9.7 g/dL — ABNORMAL LOW (ref 12.0–15.0)
Immature Granulocytes: 2 %
Lymphocytes Relative: 19 %
Lymphs Abs: 1.1 10*3/uL (ref 0.7–4.0)
MCH: 26.3 pg (ref 26.0–34.0)
MCHC: 29.5 g/dL — ABNORMAL LOW (ref 30.0–36.0)
MCV: 89.2 fL (ref 80.0–100.0)
Monocytes Absolute: 0.4 10*3/uL (ref 0.1–1.0)
Monocytes Relative: 7 %
Neutro Abs: 4.3 10*3/uL (ref 1.7–7.7)
Neutrophils Relative %: 72 %
Platelets: 184 10*3/uL (ref 150–400)
RBC: 3.69 MIL/uL — ABNORMAL LOW (ref 3.87–5.11)
RDW: 16 % — ABNORMAL HIGH (ref 11.5–15.5)
WBC: 6.1 10*3/uL (ref 4.0–10.5)
nRBC: 0 % (ref 0.0–0.2)

## 2019-12-20 LAB — BRAIN NATRIURETIC PEPTIDE: B Natriuretic Peptide: 57.3 pg/mL (ref 0.0–100.0)

## 2019-12-20 LAB — C-REACTIVE PROTEIN: CRP: 3.5 mg/dL — ABNORMAL HIGH (ref <1.0)

## 2019-12-20 LAB — MAGNESIUM: Magnesium: 1.6 mg/dL — ABNORMAL LOW (ref 1.7–2.4)

## 2019-12-20 MED ORDER — DEXAMETHASONE 6 MG PO TABS: 6.0000 mg | ORAL_TABLET | Freq: Every day | ORAL | 0 refills | Status: AC

## 2019-12-20 MED ORDER — TRULICITY 0.75 MG/0.5ML ~~LOC~~ SOAJ: 0.7500 mg | SUBCUTANEOUS | Status: AC

## 2019-12-20 MED ORDER — MAGNESIUM SULFATE 2 GM/50ML IV SOLN
2.0000 g | Freq: Once | INTRAVENOUS | Status: AC
  Administered 2019-12-20: 2 g via INTRAVENOUS
  Filled 2019-12-20: qty 50

## 2019-12-20 NOTE — Discharge Instructions (Signed)
Person Under Monitoring Name: Laura [name]  Location: [address] [place] [address]   Infection Prevention Recommendations for Individuals Confirmed to have, or Being Evaluated for, 2019 Novel Coronavirus (COVID-19) Infection Who Receive Care at Home  Individuals who are confirmed to have, or are being evaluated for, COVID-19 should follow the prevention steps below until a healthcare provider or local or state health department says they can return to normal activities.  Stay home except to get medical care You should restrict activities outside your home, except for getting medical care. Do not go to work, school, or public areas, and do not use public transportation or taxis.  Call ahead before visiting your doctor Before your medical appointment, call the healthcare provider and tell them that you have, or are being evaluated for, COVID-19 infection. This will help the healthcare provider's office take steps to keep other people from getting infected. Ask your healthcare provider to call the local or state health department.  Monitor your symptoms Seek prompt medical attention if your illness is worsening (e.g., difficulty breathing). Before going to your medical appointment, call the healthcare provider and tell them that you have, or are being evaluated for, COVID-19 infection. Ask your healthcare provider to call the local or state health department.  Wear a facemask You should wear a facemask that covers your nose and mouth when you are in the same room with other people and when you visit a healthcare provider. People who live with or visit you should also wear a facemask while they are in the same room with you.  Separate yourself from other people in your home As much as possible, you should stay in a different room from other people in your home. Also, you should use a separate bathroom, if available.  Avoid sharing household items You should not  share dishes, drinking glasses, cups, eating utensils, towels, bedding, or other items with other people in your home. After using these items, you should wash them thoroughly with soap and water.  Cover your coughs and sneezes Cover your mouth and nose with a tissue when you cough or sneeze, or you can cough or sneeze into your sleeve. Throw used tissues in a lined trash can, and immediately wash your hands with soap and water for at least 20 seconds or use an alcohol-based hand rub.  Wash your [facility] your hands often and thoroughly with soap and water for at least 20 seconds. You can use an alcohol-based hand sanitizer if soap and water are not available and if your hands are not visibly dirty. Avoid touching your eyes, nose, and mouth with unwashed hands.   Prevention Steps for Caregivers and Household Members of Individuals Confirmed to have, or Being Evaluated for, COVID-19 Infection Being Cared for in the Home  If you live with, or provide care at home for, a person confirmed to have, or being evaluated for, COVID-19 infection please follow these guidelines to prevent infection:  Follow healthcare provider's instructions Make sure that you understand and can help the patient follow any healthcare provider instructions for all care.  Provide for the patient's basic needs You should help the patient with basic needs in the home and provide support for getting groceries, prescriptions, and other personal needs.  Monitor the patient's symptoms If they are getting sicker, call his or her medical provider and tell them that the patient has, or is being evaluated for, COVID-19 infection. This will help the healthcare provider's  office take steps to keep other people from getting infected. Ask the healthcare provider to call the local or state health department.  Limit the number of people who have contact with the patient  If possible, have only one caregiver for the  patient.  Other household members should stay in another home or place of residence. If this is not possible, they should stay  in another room, or be separated from the patient as much as possible. Use a separate bathroom, if available.  Restrict visitors who do not have an essential need to be in the home.  Keep older adults, very young children, and other sick people away from the patient Keep older adults, very young children, and those who have compromised immune systems or chronic health conditions away from the patient. This includes people with chronic heart, lung, or kidney conditions, diabetes, and cancer.  Ensure good ventilation Make sure that shared spaces in the home have good air flow, such as from an air conditioner or an opened window, weather permitting.  Wash your hands often  Wash your hands often and thoroughly with soap and water for at least 20 seconds. You can use an alcohol based hand sanitizer if soap and water are not available and if your hands are not visibly dirty.  Avoid touching your eyes, nose, and mouth with unwashed hands.  Use disposable paper towels to dry your hands. If not available, use dedicated cloth towels and replace them when they become wet.  Wear a facemask and gloves  Wear a disposable facemask at all times in the room and gloves when you touch or have contact with the patient's blood, body fluids, and/or secretions or excretions, such as sweat, saliva, sputum, nasal mucus, vomit, urine, or feces.  Ensure the mask fits over your nose and mouth tightly, and do not touch it during use.  Throw out disposable facemasks and gloves after using them. Do not reuse.  Wash your hands immediately after removing your facemask and gloves.  If your personal clothing becomes contaminated, carefully remove clothing and launder. Wash your hands after handling contaminated clothing.  Place all used disposable facemasks, gloves, and other waste in a lined  container before disposing them with other household waste.  Remove gloves and wash your hands immediately after handling these items.  Do not share dishes, glasses, or other household items with the patient  Avoid sharing household items. You should not share dishes, drinking glasses, cups, eating utensils, towels, bedding, or other items with a patient who is confirmed to have, or being evaluated for, COVID-19 infection.  After the person uses these items, you should wash them thoroughly with soap and water.  Wash laundry thoroughly  Immediately remove and wash clothes or bedding that have blood, body fluids, and/or secretions or excretions, such as sweat, saliva, sputum, nasal mucus, vomit, urine, or feces, on them.  Wear gloves when handling laundry from the patient.  Read and follow directions on labels of laundry or clothing items and detergent. In general, wash and dry with the warmest temperatures recommended on the label.  Clean all areas the individual has used often  Clean all touchable surfaces, such as counters, tabletops, doorknobs, bathroom fixtures, toilets, phones, keyboards, tablets, and bedside tables, every day. Also, clean any surfaces that may have blood, body fluids, and/or secretions or excretions on them.  Wear gloves when cleaning surfaces the patient has come in contact with.  Use a diluted bleach solution (e.g., dilute bleach with 1  part bleach and 10 parts water) or a household disinfectant with a label that says EPA-registered for coronaviruses. To make a bleach solution at home, add 1 tablespoon of bleach to 1 quart (4 cups) of water. For a larger supply, add  cup of bleach to 1 gallon (16 cups) of water.  Read labels of cleaning products and follow recommendations provided on product labels. Labels contain instructions for safe and effective use of the cleaning product including precautions you should take when applying the product, such as wearing gloves or  eye protection and making sure you have good ventilation during use of the product.  Remove gloves and wash hands immediately after cleaning.  Monitor yourself for signs and symptoms of illness Caregivers and household members are considered close contacts, should monitor their health, and will be asked to limit movement outside of the home to the extent possible. Follow the monitoring steps for close contacts listed on the symptom monitoring form.   ? If you have additional questions, contact your local health department or call the epidemiologist on call at [phone] (available 24/7). ? This guidance is subject to change. For the most up-to-date guidance from [facility], please refer to their website: YouBlogs.pl

## 2019-12-20 NOTE — Discharge Summary (Signed)
Laura [name], is a 69 y.o. female  DOB 10-23-1950  MRN [id number].  Admission date:  12/15/2019  Admitting Physician  Mckinley Jewel, MD  Discharge Date:  12/20/2019   Primary MD  Chesley Noon, MD  Recommendations for primary care physician for things to follow:  -Please check CBC, CMP during next visit. -Please resume antihypertensive medications if blood pressure started to increase.  Admission Diagnosis  Acute hypoxemic respiratory failure due to COVID-19 ([place]) [U07.1, J96.01] COVID-19 virus infection [U07.1]   Discharge Diagnosis  Acute hypoxemic respiratory failure due to COVID-19 ([place]) [U07.1, J96.01] COVID-19 virus infection [U07.1]   Principal Problem:   Acute hypoxemic respiratory failure (HCC) Active Problems:   Acquired hypothyroidism   Essential hypertension   Normocytic anemia   Diabetes mellitus without complication ([place])   Pneumonia due to COVID-19 virus   AKI (acute kidney injury) ([place])   Acute hypoxemic respiratory failure due to COVID-19 [facility])      Past Medical History:  Diagnosis Date  . Anxiety   . Diabetes mellitus without complication ([place])   . GERD (gastroesophageal reflux disease)   . Hypertension   . Thyroid disease     Past Surgical History:  Procedure Laterality Date  . ABDOMINAL HYSTERECTOMY    . BREAST SURGERY     breast mass  . DILATION AND CURETTAGE OF UTERUS    . FOOT SURGERY         History of present illness and  Hospital Course:     Kindly see H&P for history of present illness and admission details, please review complete Labs, Consult reports and Test reports for all details in brief  HPI  from the history and physical done on the day of admission 12/15/2019   HPI: Laura [name] is a 69 y.o. female with medical history significant of hypertension, GERD, hypothyroidism, diabetes mellitus, obesity presents to emergency department with  worsening cough, shortness of breath and diarrhea since 1 week.  Patient tells me that she tested positive for COVID-19 6 days ago.  She continues to have worsening of her symptoms including cough, shortness of breath, diarrhea.  She reports nonbloody diarrhea 4-5 episodes per day, associated with decreased appetite, generalized weakness, lethargy.  Reports chills, nausea and headache however denies lightheadedness, dizziness, syncope, chest pain, wheezing, abdominal pain, urinary symptoms.  She denies recent COVID-19 exposure.  She did not receive COVID-19 vaccine.  Lives with her husband at home.  No history of smoking, alcohol, illicit drug use.  She uses cane for ambulation.  She does not use oxygen at home.  ED Course: Upon arrival to ED: Patient tachypneic, requiring 2 L of oxygen via nasal cannula, afebrile with no leukocytosis.  CMP shows AKI.  Chest x-ray shows multifocal pneumonia.  [facility] hospitalist consulted for admission for Covid pneumonia.  Hospital Course    Acute Hypoxic Resp. Failure due to Acute Covid 19 Viral Pneumonitis during the ongoing 2020 Covid 19 Pandemic  - she has moderate disease, she was treated with IV  remdesivir during hospital stay, she was treated with IV steroids, she is to finish another 5 days of oral Decadron as an outpatient for total of 10 days treatment, she does remain hypoxic mainly on activity, she does require 2 L nasal cannula, so oxygen has been arranged on discharge, she was encouraged to keep using incentive spirometry and flutter valve as well .  AKI due to dehydration.  - Creatinine elevated 2.27 on admission, but it has improved during hospital stay.  Creatinine was 1.28 on discharge  Hypomagnesemia -Potassium of 1.6, repleted before discharge  Hypothyroidism.  Home dose Synthroid.  Essential hypertension.  On beta-blocker at home dose.  I have stopped her Dyazide on discharge given accepting blood pressure during hospital stay only on  her beta-blockers  GERD.  On PPI.  Morbid obesity.  BMI of 47.  Follow with PCP for weight loss.  Skin candidiasis -Resolved  with nystatin powder  DM type II. resume home regmine    Discharge Condition:  Stable   Follow UP   Follow-up Information    Chesley Noon, MD Follow up in 2 week(s).   Specialty: Family Medicine Contact information: [place] [address] [phone]                 Discharge Instructions  and  Discharge Medications   Discharge Instructions    Discharge instructions   Complete by: As directed    Follow with Primary MD Chesley Noon, MD in 14 days   Get CBC, CMP,  checked  by Primary MD next visit.    Activity: As tolerated with Full fall precautions use walker/cane & assistance as needed   Disposition Home    Diet: Heart Healthy /carb modified   On your next visit with your primary care physician please Get Medicines reviewed and adjusted.   Please request your Prim.MD to go over all Hospital Tests and Procedure/Radiological results at the follow up, please get all Hospital records sent to your Prim MD by signing hospital release before you go home.   If you experience worsening of your admission symptoms, develop shortness of breath, life threatening emergency, suicidal or homicidal thoughts you must seek medical attention immediately by calling 911 or calling your MD immediately  if symptoms less severe.  You Must read complete instructions/literature along with all the possible adverse reactions/side effects for all the Medicines you take and that have been prescribed to you. Take any new Medicines after you have completely understood and accpet all the possible adverse reactions/side effects.   Do not drive, operating heavy machinery, perform activities at heights, swimming or participation in water activities or provide baby sitting services if your were admitted for syncope or siezures until  you have seen by Primary MD or a Neurologist and advised to do so again.  Do not drive when taking Pain medications.    Do not take more than prescribed Pain, Sleep and Anxiety Medications  Special Instructions: If you have smoked or chewed Tobacco  in the last 2 yrs please stop smoking, stop any regular Alcohol  and or any Recreational drug use.  Wear Seat belts while driving.   Please note  You were cared for by a hospitalist during your hospital stay. If you have any questions about your discharge medications or the care you received while you were in the hospital after you are discharged, you can call the unit and asked to speak with the hospitalist on call if  the hospitalist that took care of you is not available. Once you are discharged, your primary care physician will handle any further medical issues. Please note that NO REFILLS for any discharge medications will be authorized once you are discharged, as it is imperative that you return to your primary care physician (or establish a relationship with a primary care physician if you do not have one) for your aftercare needs so that they can reassess your need for medications and monitor your lab values.   Increase activity slowly   Complete by: As directed      Allergies as of 12/20/2019      Reactions   Fluconazole Palpitations   Kombiglyze [saxagliptin-metformin Er] Shortness Of Breath, Palpitations, Other (See Comments)   "My heart stops beating"   Onglyza [saxagliptin] Shortness Of Breath, Other (See Comments)   "MY heart stops beating"   Toviaz [fesoterodine] Shortness Of Breath   Victoza [liraglutide] Shortness Of Breath   Augmentin [amoxicillin-pot Clavulanate] Diarrhea, Nausea And Vomiting   Brilliant Blue Fcf Hives   Cephalosporins Nausea And Vomiting   Cytomel [liothyronine] Hives   Iohexol Hives, Other (See Comments)   HIVES S/P CONTRAST INJECTION//A.C., Onset Date: [id number]   Ivp Dye [iodinated Diagnostic Agents]  Hives, Other (See Comments)   And bad sweating   Allegra [fexofenadine Hcl] Other (See Comments)   "Bad stomach cramps"   Avapro [irbesartan] Other (See Comments)   "Stayed really sick"   Cephalexin Other (See Comments)   "Makes me really sick"   Januvia [sitagliptin] Other (See Comments)   Ineffective   Lotrel [amlodipine Besy-benazepril Hcl] Other (See Comments)   "Could not hold up my head"   Metformin Other (See Comments)   Reaction not noted by patient    Metformin And Related Diarrhea   Ozempic (0.25 Or 0.5 Mg-dose) [semaglutide(0.25 Or 0.5mg -dos)] Diarrhea, Other (See Comments)   Severe diarrhea   Pioglitazone Other (See Comments)   Reaction not noted by patient- patient taking in 2021   Naproxen Sodium Palpitations   Semaglutide Diarrhea   Sulfa Antibiotics Palpitations      Medication List    STOP taking these medications   diazepam 5 MG tablet Commonly known as: VALIUM   telmisartan 40 MG tablet Commonly known as: MICARDIS   triamterene-hydrochlorothiazide 37.5-25 MG capsule Commonly known as: DYAZIDE     TAKE these medications   aspirin 81 MG tablet Take 81 mg by mouth daily.   budesonide-formoterol 160-4.5 MCG/ACT inhaler Commonly known as: SYMBICORT Inhale 1 puff into the lungs 2 (two) times daily as needed (for flares).   dexamethasone 6 MG tablet Commonly known as: DECADRON Take 1 tablet (6 mg total) by mouth daily. Start taking on: December 21, 2019   Dexilant 60 MG capsule Generic drug: dexlansoprazole Take 60 mg by mouth daily.   FLINTSTONES PLUS IRON PO Take 1 tablet by mouth daily with breakfast.   glipiZIDE 10 MG 24 hr tablet Commonly known as: GLUCOTROL XL Take 10 mg by mouth daily with breakfast.   glucosamine-chondroitin 500-400 MG tablet Take 3 tablets by mouth daily.   Klor-Con M20 20 MEQ tablet Generic drug: potassium chloride SA Take 20 mEq by mouth daily.   metoprolol succinate 100 MG 24 hr tablet Commonly known as:  TOPROL-XL Take 100 mg by mouth daily. Take with or immediately following a meal.   pioglitazone 30 MG tablet Commonly known as: ACTOS Take 30 mg by mouth daily.   Synthroid 200 MCG tablet Generic drug: levothyroxine Take  200 mcg by mouth daily before breakfast.   Synthroid 25 MCG tablet Generic drug: levothyroxine Take 25 mcg by mouth daily before breakfast.   Trulicity 2.58 NI/7.7OE Sopn Generic drug: Dulaglutide Inject 0.5 mLs (0.75 mg total) into the skin every 7 (seven) days. What changed: how much to take   Vitamin D-3 25 MCG (1000 UT) Caps Take 1,000 Units by mouth daily.            Durable Medical Equipment  (From admission, onward)         Start     Ordered   12/19/19 1201  For home use only DME oxygen  Once       Question Answer Comment  Length of Need 6 Months   Mode or (Route) Nasal cannula   Liters per Minute 2   Oxygen delivery system Gas      12/19/19 1201            Diet and Activity recommendation: See Discharge Instructions above   Consults obtained -  None   Major procedures and Radiology Reports - PLEASE review detailed and final reports for all details, in brief -     DG Chest Portable 1 View  Result Date: 12/15/2019 CLINICAL DATA:  69 year old female with shortness of breath. Positive COVID-19. EXAM: PORTABLE CHEST 1 VIEW COMPARISON:  Chest radiograph dated 09/10/2017. FINDINGS: Bilateral faint streaky densities primarily involving the peripheral and subpleural region of the mid to lower lung field most consistent with pneumonia, likely viral or atypical in etiology. Clinical correlation and follow-up recommended. No lobar consolidation, pleural effusion, pneumothorax. Mild cardiomegaly. No acute osseous pathology. IMPRESSION: 1. Multifocal pneumonia, likely viral or atypical in etiology. Clinical correlation and follow-up recommended. 2. Mild cardiomegaly. Electronically Signed   By: Anner Crete M.D.   On: 12/15/2019 15:14     Micro Results     Recent Results (from the past 240 hour(s))  SARS Coronavirus 2 by RT PCR (hospital order, performed in [facility] hospital lab) Nasopharyngeal Nasopharyngeal Swab     Status: Abnormal   Collection Time: 12/15/19  5:57 PM   Specimen: Nasopharyngeal Swab  Result Value Ref Range Status   SARS Coronavirus 2 POSITIVE (A) NEGATIVE Final    Comment: RESULT CALLED TO, READ BACK BY AND VERIFIED WITH: E,ROBERTSON @1935  12/15/19 EB (NOTE) SARS-CoV-2 target nucleic acids are DETECTED  SARS-CoV-2 RNA is generally detectable in upper respiratory specimens  during the acute phase of infection.  Positive results are indicative  of the presence of the identified virus, but do not rule out bacterial infection or co-infection with other pathogens not detected by the test.  Clinical correlation with patient history and  other diagnostic information is necessary to determine patient infection status.  The expected result is negative.  Fact Sheet for Patients:   StrictlyIdeas.no   Fact Sheet for Healthcare Providers:   BankingDealers.co.za    This test is not yet approved or cleared by the [place] FDA and  has been authorized for detection and/or diagnosis of SARS-CoV-2 by FDA under an Emergency Use Authorization (EUA).  This EUA will remain in effect (meaning this test can  be used) for the duration of  the COVID-19 declaration under Section 564(b)(1) of the Act, 21 U.S.C. section 360-bbb-3(b)(1), unless the authorization is terminated or revoked sooner.  Performed at [facility], [place] [address], [place], [place] [address]   Culture, blood (Routine X 2) w Reflex to ID Panel     Status:  None   Collection Time: 12/15/19  5:58 PM   Specimen: BLOOD RIGHT FOREARM  Result Value Ref Range Status   Specimen Description BLOOD RIGHT FOREARM  Final   Special Requests   Final    BOTTLES DRAWN AEROBIC AND ANAEROBIC Blood  Culture results may not be optimal due to an inadequate volume of blood received in culture bottles   Culture   Final    NO GROWTH 5 DAYS Performed at [facility], [place] [address], [place], [place] [address]    Report Status 12/20/2019 FINAL  Final  Culture, blood (Routine X 2) w Reflex to ID Panel     Status: None   Collection Time: 12/15/19  6:01 PM   Specimen: BLOOD  Result Value Ref Range Status   Specimen Description BLOOD SITE NOT SPECIFIED  Final   Special Requests   Final    BOTTLES DRAWN AEROBIC AND ANAEROBIC Blood Culture results may not be optimal due to an inadequate volume of blood received in culture bottles   Culture   Final    NO GROWTH 5 DAYS Performed at [facility], [place] [address] [place], [place] [address]    Report Status 12/20/2019 FINAL  Final       Today   Subjective:   Laura [name] today has no headache,no chest abdominal pain,no new weakness tingling or numbness, he ambulated walking yesterday in the hallway with oxygen.  Objective:   Blood pressure (!) 137/51, pulse 50, temperature 98.7 F (37.1 C), temperature source Oral, resp. rate 22, height 5\' 3"  (1.6 m), weight (!) 120 kg, SpO2 95 %.   Intake/Output Summary (Last 24 hours) at 12/20/2019 1157 Last data filed at 12/20/2019 0449 Gross per 24 hour  Intake --  Output 650 ml  Net -650 ml    Exam Awake Alert, Oriented x 3, No new F.N deficits, Normal affect Symmetrical Chest wall movement, Good air movement bilaterally, CTAB RRR,No Gallops,Rubs or new Murmurs, No Parasternal Heave +ve B.Sounds, Abd Soft, Non tender, No rebound -guarding or rigidity. No Cyanosis, Clubbing or edema, No new Rash or bruise  Data Review   CBC w Diff:  Lab Results  Component Value Date   WBC 6.1 12/20/2019   HGB 9.7 (L) 12/20/2019   HGB 11.9 03/21/2007   HCT 32.9 (L) 12/20/2019   HCT 34.5 (L) 03/21/2007   PLT 184 12/20/2019   PLT  Clumped Platelets--Appears Adequate 03/21/2007   LYMPHOPCT 19  12/20/2019   LYMPHOPCT 34.8 03/21/2007   MONOPCT 7 12/20/2019   MONOPCT 6.0 03/21/2007   EOSPCT 0 12/20/2019   EOSPCT 2.7 03/21/2007   BASOPCT 0 12/20/2019   BASOPCT 0.4 03/21/2007    CMP:  Lab Results  Component Value Date   NA 144 12/20/2019   K 4.1 12/20/2019   CL 108 12/20/2019   CO2 27 12/20/2019   BUN 38 (H) 12/20/2019   CREATININE 1.28 (H) 12/20/2019   PROT 5.9 (L) 12/20/2019   ALBUMIN 2.5 (L) 12/20/2019   BILITOT 0.5 12/20/2019   ALKPHOS 46 12/20/2019   AST 23 12/20/2019   ALT 24 12/20/2019  .   Total Time in preparing paper work, data evaluation and todays exam - 45 minutes  Phillips Climes M.D on 12/20/2019 at 11:57 AM  [facility]   Office  [phone]

## 2019-12-20 NOTE — Progress Notes (Signed)
Laura [name] to be D/C'd Home per MD order.  Discussed with the patient and all questions fully answered.  VSS, Skin clean, dry and intact without evidence of skin break down, no evidence of skin tears noted. IV catheter discontinued intact. Site without signs and symptoms of complications. Dressing and pressure applied.  An After Visit Summary was printed and given to the patient. Patient received prescription.  D/c education completed with patient including follow up instructions, medication list, d/c activities limitations if indicated, with other d/c instructions as indicated by MD - patient able to verbalize understanding, all questions fully answered.   Patient instructed to return to ED, call 911, or call MD for any changes in condition.   Patient escorted via [name], and D/C home via private auto.  Jeanella Craze 12/20/2019 3:22 PM

## 2019-12-20 NOTE — Progress Notes (Signed)
Patient currently up in chair.  Patient tolerated transfer gfrom bed to chair well.

## 2019-12-20 NOTE — Plan of Care (Signed)
  Problem: Education: Goal: Knowledge of General Education information will improve Description: Including pain rating scale, medication(s)/side effects and non-pharmacologic comfort measures Outcome: Progressing   Problem: Health Behavior/Discharge Planning: Goal: Ability to manage health-related needs will improve Outcome: Progressing   Problem: Clinical Measurements: Goal: Ability to maintain clinical measurements within normal limits will improve Outcome: Progressing Goal: Will remain free from infection Outcome: Progressing Goal: Diagnostic test results will improve Outcome: Progressing Goal: Respiratory complications will improve Outcome: Progressing Goal: Cardiovascular complication will be avoided Outcome: Progressing   Problem: Activity: Goal: Risk for activity intolerance will decrease Outcome: Progressing Note: Slow progress being made towards goals; patient sat inchair for majority of evening and returnedt o bed for bedtime.   Problem: Nutrition: Goal: Adequate nutrition will be maintained Outcome: Progressing   Problem: Coping: Goal: Level of anxiety will decrease Outcome: Progressing   Problem: Elimination: Goal: Will not experience complications related to bowel motility Outcome: Progressing Goal: Will not experience complications related to urinary retention Outcome: Progressing   Problem: Pain Managment: Goal: General experience of comfort will improve Outcome: Progressing   Problem: Safety: Goal: Ability to remain free from injury will improve Outcome: Progressing   Problem: Skin Integrity: Goal: Risk for impaired skin integrity will decrease Outcome: Progressing   Problem: Education: Goal: Knowledge of risk factors and measures for prevention of condition will improve Outcome: Progressing   Problem: Coping: Goal: Psychosocial and spiritual needs will be supported Outcome: Progressing   Problem: Respiratory: Goal: Will maintain a patent  airway Outcome: Progressing Goal: Complications related to the disease process, condition or treatment will be avoided or minimized Outcome: Progressing   Problem: Education: Goal: Knowledge of disease and its progression will improve Outcome: Progressing   Problem: Health Behavior/Discharge Planning: Goal: Ability to manage health-related needs will improve Outcome: Progressing   Problem: Clinical Measurements: Goal: Complications related to the disease process or treatment will be avoided or minimized Outcome: Progressing   Problem: Activity: Goal: Activity intolerance will improve Outcome: Progressing   Problem: Fluid Volume: Goal: Fluid volume balance will be maintained or improved Outcome: Progressing   Problem: Respiratory: Goal: Respiratory symptoms related to disease process will be avoided Outcome: Progressing

## 2020-05-05 ENCOUNTER — Other Ambulatory Visit: Payer: Self-pay | Admitting: Family Medicine

## 2020-05-05 DIAGNOSIS — Z1231 Encounter for screening mammogram for malignant neoplasm of breast: Secondary | ICD-10-CM

## 2020-06-16 ENCOUNTER — Ambulatory Visit: Payer: BC Managed Care – PPO

## 2021-08-03 ENCOUNTER — Ambulatory Visit: Payer: Medicare Other | Admitting: Dermatology

## 2021-08-12 IMAGING — DX DG CHEST 1V PORT
1 series · 1 of 1 positions shown · non-contrast
Comparison: Chest radiograph dated 09/10/2017.

CLINICAL DATA: 69-year-old female with shortness of breath.
Positive [id number].

EXAM:
PORTABLE CHEST 1 VIEW

[chest ap]
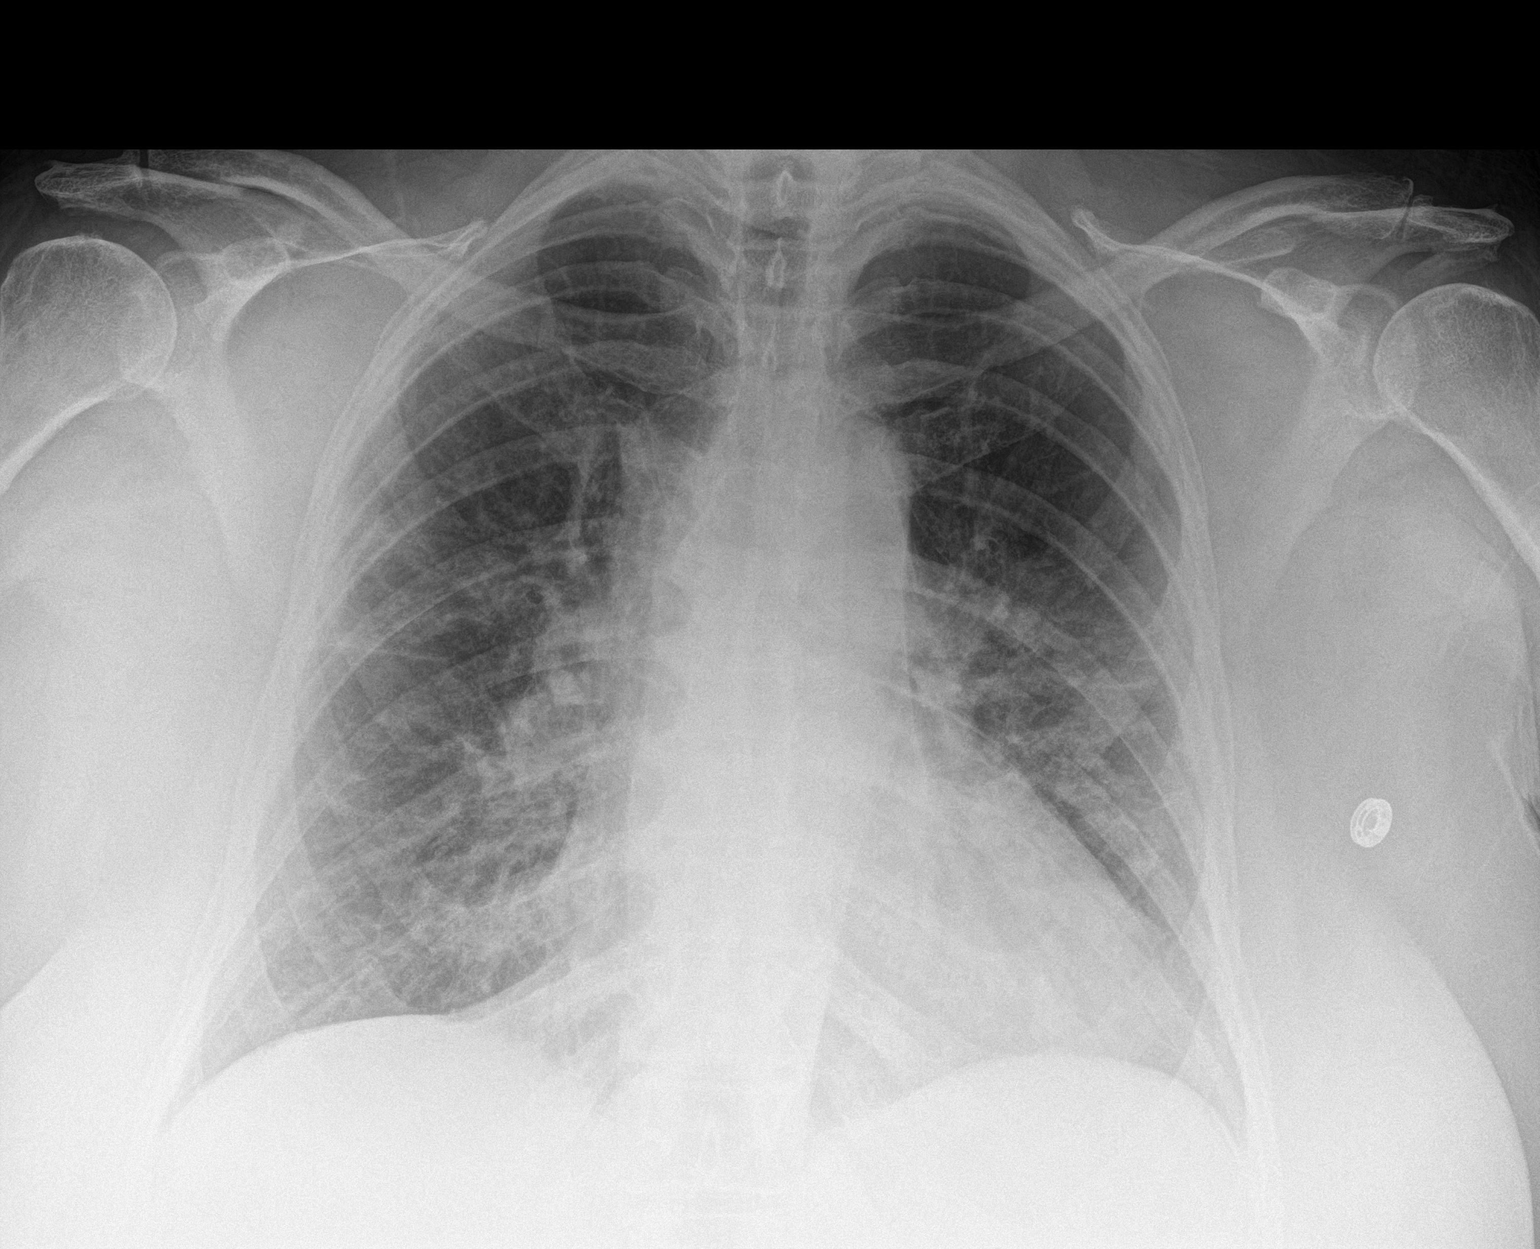

[1 of 1 positions shown; findings below may reference images not displayed]

FINDINGS: Bilateral faint streaky densities primarily involving the peripheral
and subpleural region of the mid to lower lung field most consistent
with pneumonia, likely viral or atypical in etiology. Clinical
correlation and follow-up recommended. No lobar consolidation,
pleural effusion, pneumothorax. Mild cardiomegaly. No acute osseous
pathology.
IMPRESSION: 1. Multifocal pneumonia, likely viral or atypical in etiology.
Clinical correlation and follow-up recommended.
2. Mild cardiomegaly.
